# Patient Record
Sex: Female | Born: 1980 | Race: White | Hispanic: No | Marital: Single | Smoking: Current every day smoker
Health system: Southern US, Community
[De-identification: ages and names within clinical notes are randomized; demographics above are authoritative.]

## PROBLEM LIST (undated history)

## (undated) DIAGNOSIS — F32A Depression, unspecified: Secondary | ICD-10-CM

## (undated) DIAGNOSIS — F329 Major depressive disorder, single episode, unspecified: Secondary | ICD-10-CM

## (undated) DIAGNOSIS — IMO0002 Reserved for concepts with insufficient information to code with codable children: Secondary | ICD-10-CM

## (undated) DIAGNOSIS — R87619 Unspecified abnormal cytological findings in specimens from cervix uteri: Secondary | ICD-10-CM

## (undated) DIAGNOSIS — R51 Headache: Secondary | ICD-10-CM

## (undated) DIAGNOSIS — N390 Urinary tract infection, site not specified: Secondary | ICD-10-CM

## (undated) DIAGNOSIS — F419 Anxiety disorder, unspecified: Secondary | ICD-10-CM

## (undated) HISTORY — PX: COLPOSCOPY: SHX161

---

## 2002-05-12 ENCOUNTER — Ambulatory Visit (HOSPITAL_COMMUNITY): Admission: RE | Admit: 2002-05-12 | Discharge: 2002-05-12 | Payer: Self-pay

## 2002-05-20 ENCOUNTER — Inpatient Hospital Stay (HOSPITAL_COMMUNITY): Admission: AD | Admit: 2002-05-20 | Discharge: 2002-05-20 | Payer: Self-pay

## 2002-07-14 ENCOUNTER — Ambulatory Visit (HOSPITAL_COMMUNITY): Admission: RE | Admit: 2002-07-14 | Discharge: 2002-07-14 | Payer: Self-pay

## 2002-08-21 ENCOUNTER — Observation Stay (HOSPITAL_COMMUNITY): Admission: AD | Admit: 2002-08-21 | Discharge: 2002-08-22 | Payer: Self-pay

## 2002-08-29 ENCOUNTER — Inpatient Hospital Stay (HOSPITAL_COMMUNITY): Admission: AD | Admit: 2002-08-29 | Discharge: 2002-08-29 | Payer: Self-pay

## 2002-08-30 ENCOUNTER — Inpatient Hospital Stay (HOSPITAL_COMMUNITY): Admission: AD | Admit: 2002-08-30 | Discharge: 2002-08-30 | Payer: Self-pay

## 2002-09-22 ENCOUNTER — Ambulatory Visit (HOSPITAL_COMMUNITY): Admission: RE | Admit: 2002-09-22 | Discharge: 2002-09-22 | Payer: Self-pay

## 2002-09-26 ENCOUNTER — Encounter (HOSPITAL_COMMUNITY): Admission: RE | Admit: 2002-09-26 | Discharge: 2002-10-26 | Payer: Self-pay

## 2002-10-30 ENCOUNTER — Encounter (HOSPITAL_COMMUNITY): Admission: RE | Admit: 2002-10-30 | Discharge: 2002-11-17 | Payer: Self-pay

## 2002-11-02 ENCOUNTER — Inpatient Hospital Stay (HOSPITAL_COMMUNITY): Admission: AD | Admit: 2002-11-02 | Discharge: 2002-11-02 | Payer: Self-pay

## 2002-11-17 ENCOUNTER — Inpatient Hospital Stay (HOSPITAL_COMMUNITY): Admission: AD | Admit: 2002-11-17 | Discharge: 2002-11-19 | Payer: Self-pay

## 2008-06-22 ENCOUNTER — Other Ambulatory Visit: Admission: RE | Admit: 2008-06-22 | Discharge: 2008-06-22 | Payer: Self-pay | Admitting: Family Medicine

## 2008-07-04 ENCOUNTER — Encounter: Admission: RE | Admit: 2008-07-04 | Discharge: 2008-07-04 | Payer: Self-pay | Admitting: Family Medicine

## 2010-03-25 ENCOUNTER — Emergency Department (HOSPITAL_COMMUNITY): Admission: EM | Admit: 2010-03-25 | Discharge: 2010-03-26 | Payer: Self-pay | Admitting: Emergency Medicine

## 2010-03-26 ENCOUNTER — Inpatient Hospital Stay (HOSPITAL_COMMUNITY): Admission: EM | Admit: 2010-03-26 | Discharge: 2010-03-29 | Payer: Self-pay | Admitting: Psychiatry

## 2010-03-27 ENCOUNTER — Ambulatory Visit: Payer: Self-pay | Admitting: Psychiatry

## 2010-11-16 NOTE — L&D Delivery Note (Signed)
NSD of viable 7 pound female, Apgars 9/9, over intact perineum.  PDI- grossly normal, 2A/1V.  No tears. Breast feed.

## 2011-02-03 LAB — POCT PREGNANCY, URINE: Preg Test, Ur: NEGATIVE

## 2011-02-03 LAB — DIFFERENTIAL
Basophils Absolute: 0 K/uL (ref 0.0–0.1)
Basophils Relative: 0 % (ref 0–1)
Eosinophils Absolute: 0.1 K/uL (ref 0.0–0.7)
Eosinophils Relative: 1 % (ref 0–5)
Lymphocytes Relative: 24 % (ref 12–46)
Lymphs Abs: 2 K/uL (ref 0.7–4.0)
Monocytes Absolute: 0.5 K/uL (ref 0.1–1.0)
Monocytes Relative: 7 % (ref 3–12)
Neutro Abs: 5.5 K/uL (ref 1.7–7.7)
Neutrophils Relative %: 67 % (ref 43–77)

## 2011-02-03 LAB — RAPID URINE DRUG SCREEN, HOSP PERFORMED
Amphetamines: NOT DETECTED
Barbiturates: NOT DETECTED
Benzodiazepines: POSITIVE — AB
Cocaine: POSITIVE — AB
Opiates: NOT DETECTED
Tetrahydrocannabinol: POSITIVE — AB

## 2011-02-03 LAB — CBC
HCT: 40.6 % (ref 36.0–46.0)
Hemoglobin: 13.9 g/dL (ref 12.0–15.0)
Platelets: 175 10*3/uL (ref 150–400)
RDW: 13.2 % (ref 11.5–15.5)
WBC: 8.2 10*3/uL (ref 4.0–10.5)

## 2011-02-03 LAB — POCT I-STAT, CHEM 8
BUN: 9 mg/dL (ref 6–23)
Chloride: 105 mEq/L (ref 96–112)
HCT: 45 % (ref 36.0–46.0)
Potassium: 4.1 mEq/L (ref 3.5–5.1)

## 2011-04-03 NOTE — H&P (Signed)
   NAME:  Katie Marsh, Katie Marsh                             ACCOUNT NO.:  [id number]   MEDICAL RECORD NO.:  [id number]                   PATIENT TYPE:  MAT   LOCATION:  [facility]                                 FACILITY:  [facility]   PHYSICIAN:  Ronda Fairly. Galen Daft, M.D.              DATE OF BIRTH:  1981/09/09   DATE OF ADMISSION:  08/29/2002  DATE OF DISCHARGE:  08/29/2002                                HISTORY & PHYSICAL   CHIEF COMPLAINT:  Pelvic pain.   HISTORY OF PRESENT ILLNESS:  The patient presented to the emergency room  with lower abdominal pain, threatened preterm labor.  No rupture of  membranes, no vaginal bleeding.  She reported a crampy feeling in the lower  pelvis.  She is currently 51 to [redacted] weeks gestation with an estimated date of  confinement of November 16, 2002.   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  She is also a cigarette  smoker.   CURRENT MEDICATIONS:  Prenatal vitamins.   ALLERGIES:  No known drug allergies.   PAST SURGICAL HISTORY:  None.   FAMILY HISTORY:  Grandfather with diabetes.  Father with hypertension.  Breast cancer in a great-grandmother.   SOCIAL HISTORY:  She is a smoker and she works as a Child psychotherapist.   PHYSICAL EXAMINATION:  VITAL SIGNS:  The fetal heart rate is 158.  Her blood  pressure is 110/74.  Height 5 feet 3 inches.  HEENT:  Unremarkable.  ABDOMEN:  Soft, no hepatosplenomegaly, with size equal to dates.  Nontender  examination.  No CVA tenderness.  PELVIC:  Normal external genitalia.  No lesions of the vulva, vagina, or  cervix.  The cervix is 50% effaced with some bulging of the lower uterine  segment from the fetal head, probably +1 station.  The cervix itself is  closed.  FETAL HEART MONITOR:  Reassuring, 158 beats per minute average, and the  variability is appropriate for gestational age, not technically reactive.  There are no uterine contractions noted.  There  is uterine irritability.   ASSESSMENT:  Threatened preterm labor.   PLAN:  Observation and continued fetal monitoring.                                               Ronda Fairly. Galen Daft, M.D.     NJT/MEDQ  D:  09/01/2002  T:  09/01/2002  Job:  [id number]

## 2011-05-14 LAB — HEPATITIS B SURFACE ANTIGEN: Hepatitis B Surface Ag: NEGATIVE

## 2011-05-14 LAB — RPR: RPR: NONREACTIVE

## 2011-05-14 LAB — ANTIBODY SCREEN: Antibody Screen: NEGATIVE

## 2011-05-14 LAB — ABO/RH

## 2011-08-20 ENCOUNTER — Inpatient Hospital Stay (HOSPITAL_COMMUNITY)
Admission: AD | Admit: 2011-08-20 | Discharge: 2011-08-20 | Disposition: A | Discharge status: Home / Self Care | Payer: Self-pay | Source: Ambulatory Visit | Attending: Obstetrics and Gynecology | Admitting: Obstetrics and Gynecology

## 2011-08-20 ENCOUNTER — Encounter (HOSPITAL_COMMUNITY): Payer: Self-pay

## 2011-08-20 DIAGNOSIS — O4700 False labor before 37 completed weeks of gestation, unspecified trimester: Secondary | ICD-10-CM | POA: Insufficient documentation

## 2011-08-20 HISTORY — DX: Anxiety disorder, unspecified: F41.9

## 2011-08-20 HISTORY — DX: Urinary tract infection, site not specified: N39.0

## 2011-08-20 HISTORY — DX: Headache: R51

## 2011-08-20 HISTORY — DX: Depression, unspecified: F32.A

## 2011-08-20 HISTORY — DX: Reserved for concepts with insufficient information to code with codable children: IMO0002

## 2011-08-20 HISTORY — DX: Major depressive disorder, single episode, unspecified: F32.9

## 2011-08-20 HISTORY — DX: Unspecified abnormal cytological findings in specimens from cervix uteri: R87.619

## 2011-08-20 LAB — URINALYSIS, ROUTINE W REFLEX MICROSCOPIC
Glucose, UA: NEGATIVE mg/dL
Nitrite: NEGATIVE
Specific Gravity, Urine: 1.02 (ref 1.005–1.030)
pH: 7 (ref 5.0–8.0)

## 2011-08-20 LAB — URINE MICROSCOPIC-ADD ON

## 2011-08-20 NOTE — ED Provider Notes (Signed)
History     Chief Complaint  Patient presents with  . Contractions   HPIMyra A West30 y.o.patient of Dr. Ewell Poe who is now 26 weeks.  Presents with tightening of her stomach and pressure that began for 5 days. States she pain subsides with rest and restarts with activity.   Called the office yesterday instructed to hydrated.  Hx of preterm labor, tx with meds and rest, went to term.  G2 P1.  Reports spotting a few months back but none since.  States the tightening became more uncomfortable this am.  Reports an uncomplicated pregnancy so far.  Patient appears comfortable.      Past Medical History  Diagnosis Date  . Preterm labor   . Headache   . Asthma     as child  . Urinary tract infection   . Abnormal Pap smear     x1, rpt wnl  . Anxiety   . Depression     hx of med usuage    Past Surgical History  Procedure Date  . No past surgeries     No family history on file.  History  Substance Use Topics  . Smoking status: Current Everyday Smoker -- 0.2 packs/day for 15 years  . Smokeless tobacco: Never Used  . Alcohol Use: No    Allergies: No Known Allergies  Prescriptions prior to admission  Medication Sig Dispense Refill  . prenatal vitamin w/FE, FA (PRENATAL 1 + 1) 27-1 MG TABS Take 1 tablet by mouth daily.          Review of Systems  Gastrointestinal: Abdominal pain: describes as pressure upper mid abdomen.  Genitourinary:       Neg for bleeding.  Musculoskeletal:       Hip pain X 1 month.   Physical Exam   Blood pressure 94/45, pulse 75, temperature 98.8 F (37.1 C), temperature source Oral, resp. rate 18, height 5\' 3"  (1.6 Marsh), weight 133 lb 9.6 oz (60.601 kg), SpO2 100.00%.  Physical Exam    CERVICAL EXAM BY JOLYNN, RN  Ext os fingertip, internal os closed.  30% effaced.    MAU Course  Procedures Fetal Monitor shows FHR 145-150.  Reassuring.  Occasional uterine irritability lasting 30 secs MDM Reported MSE to Dr. Claiborne Billings @3 :08pm.  Check cervix, if  closed may discharge to home with instructions to call if she has >6 contractions in 1 hour.  Keep scheduled appointment Reported by Early Osmond, RN that Dr. Claiborne Billings had called after reviewing her FMS wants to continue to monitor.  Now having contractions as opposed to irritability.  Early Osmond, RN reported repeat cervical exam.  Per her report, Dr. Claiborne Billings reviewed strip and gave discharge orders. Assessment and Plan  A:  Braxton-Hicks contractions P   Per Dr. Claiborne Billings.  Katie Marsh,Katie Marsh 08/20/2011, 2:55 PM   Katie Holmes, NP 08/20/11 1859

## 2011-08-20 NOTE — Progress Notes (Signed)
Pt states she has been having contractions since Sunday but have become more painful. Has not timed them. Reports good fetal movement and no bleeding or leaking fluid.

## 2011-11-13 ENCOUNTER — Inpatient Hospital Stay (HOSPITAL_COMMUNITY)
Admission: AD | Admit: 2011-11-13 | Discharge: 2011-11-17 | DRG: 767 | Disposition: A | Discharge status: Home / Self Care | Payer: Medicaid Other | Source: Ambulatory Visit | Attending: Obstetrics & Gynecology | Admitting: Obstetrics & Gynecology

## 2011-11-13 DIAGNOSIS — Z302 Encounter for sterilization: Secondary | ICD-10-CM

## 2011-11-13 DIAGNOSIS — Z3480 Encounter for supervision of other normal pregnancy, unspecified trimester: Secondary | ICD-10-CM

## 2011-11-13 NOTE — Progress Notes (Signed)
Pt states, " I've had contractions since 7 pm and they have gotten closer in the last hour. I think I lost my mucus plug tonight too."

## 2011-11-14 ENCOUNTER — Encounter (HOSPITAL_COMMUNITY): Payer: Self-pay | Admitting: Anesthesiology

## 2011-11-14 ENCOUNTER — Inpatient Hospital Stay (HOSPITAL_COMMUNITY): Payer: Medicaid Other | Admitting: Anesthesiology

## 2011-11-14 ENCOUNTER — Encounter (HOSPITAL_COMMUNITY): Payer: Self-pay | Admitting: Family Medicine

## 2011-11-14 ENCOUNTER — Encounter (HOSPITAL_COMMUNITY): Payer: Self-pay | Admitting: *Deleted

## 2011-11-14 LAB — CBC
HCT: 39.3 % (ref 36.0–46.0)
HCT: 35 % — ABNORMAL LOW (ref 36.0–46.0)
Hemoglobin: 11.7 g/dL — ABNORMAL LOW (ref 12.0–15.0)
MCHC: 33.4 g/dL (ref 30.0–36.0)
MCV: 89.1 fL (ref 78.0–100.0)
Platelets: 132 10*3/uL — ABNORMAL LOW (ref 150–400)
RBC: 4.41 MIL/uL (ref 3.87–5.11)
WBC: 18.3 10*3/uL — ABNORMAL HIGH (ref 4.0–10.5)

## 2011-11-14 LAB — ABO/RH: ABO/RH(D): O POS

## 2011-11-14 MED ORDER — ACETAMINOPHEN 325 MG PO TABS: 650.0000 mg | ORAL_TABLET | ORAL | Status: DC | PRN

## 2011-11-14 MED ORDER — SIMETHICONE 80 MG PO CHEW: 80.0000 mg | CHEWABLE_TABLET | ORAL | Status: DC | PRN

## 2011-11-14 MED ORDER — LIDOCAINE HCL 1.5 % IJ SOLN
INTRAMUSCULAR | Status: DC | PRN
  Administered 2011-11-14: 4 mL via EPIDURAL
  Administered 2011-11-14: 4 mL via INTRADERMAL

## 2011-11-14 MED ORDER — OXYTOCIN BOLUS FROM INFUSION
500.0000 mL | Freq: Once | INTRAVENOUS | Status: DC
  Filled 2011-11-14: qty 500
  Filled 2011-11-14: qty 1000

## 2011-11-14 MED ORDER — CITRIC ACID-SODIUM CITRATE 334-500 MG/5ML PO SOLN: 30.0000 mL | ORAL | Status: DC | PRN

## 2011-11-14 MED ORDER — FENTANYL 2.5 MCG/ML BUPIVACAINE 1/10 % EPIDURAL INFUSION (WH - ANES)
INTRAMUSCULAR | Status: DC | PRN
  Administered 2011-11-14: 14 mL/h via EPIDURAL

## 2011-11-14 MED ORDER — TETANUS-DIPHTH-ACELL PERTUSSIS 5-2.5-18.5 LF-MCG/0.5 IM SUSP
0.5000 mL | Freq: Once | INTRAMUSCULAR | Status: AC
  Administered 2011-11-15: 0.5 mL via INTRAMUSCULAR

## 2011-11-14 MED ORDER — LIDOCAINE HCL (PF) 1 % IJ SOLN
30.0000 mL | INTRAMUSCULAR | Status: DC | PRN
  Filled 2011-11-14: qty 30

## 2011-11-14 MED ORDER — ONDANSETRON HCL 4 MG/2ML IJ SOLN: 4.0000 mg | INTRAMUSCULAR | Status: DC | PRN

## 2011-11-14 MED ORDER — ONDANSETRON HCL 4 MG PO TABS: 4.0000 mg | ORAL_TABLET | ORAL | Status: DC | PRN

## 2011-11-14 MED ORDER — SODIUM CHLORIDE 0.9 % IV SOLN: 250.0000 mL | INTRAVENOUS | Status: DC | PRN

## 2011-11-14 MED ORDER — OXYTOCIN 20 UNITS IN LACTATED RINGERS INFUSION - SIMPLE
125.0000 mL/h | Freq: Once | INTRAVENOUS | Status: AC
  Administered 2011-11-14: 999 mL/h via INTRAVENOUS

## 2011-11-14 MED ORDER — FLEET ENEMA 7-19 GM/118ML RE ENEM: 1.0000 | ENEMA | RECTAL | Status: DC | PRN

## 2011-11-14 MED ORDER — PHENYLEPHRINE 40 MCG/ML (10ML) SYRINGE FOR IV PUSH (FOR BLOOD PRESSURE SUPPORT)
80.0000 ug | PREFILLED_SYRINGE | INTRAVENOUS | Status: DC | PRN
  Filled 2011-11-14: qty 5

## 2011-11-14 MED ORDER — SODIUM CHLORIDE 0.9 % IJ SOLN: 3.0000 mL | Freq: Two times a day (BID) | INTRAMUSCULAR | Status: DC

## 2011-11-14 MED ORDER — FENTANYL 2.5 MCG/ML BUPIVACAINE 1/10 % EPIDURAL INFUSION (WH - ANES)
14.0000 mL/h | INTRAMUSCULAR | Status: DC
  Filled 2011-11-14: qty 60

## 2011-11-14 MED ORDER — OXYCODONE-ACETAMINOPHEN 5-325 MG PO TABS
1.0000 | ORAL_TABLET | ORAL | Status: DC | PRN
  Administered 2011-11-14 – 2011-11-17 (×6): 1 via ORAL
  Filled 2011-11-14 (×6): qty 1

## 2011-11-14 MED ORDER — WITCH HAZEL-GLYCERIN EX PADS: 1.0000 "application " | MEDICATED_PAD | CUTANEOUS | Status: DC | PRN

## 2011-11-14 MED ORDER — SODIUM CHLORIDE 0.9 % IJ SOLN: 3.0000 mL | INTRAMUSCULAR | Status: DC | PRN

## 2011-11-14 MED ORDER — EPHEDRINE 5 MG/ML INJ: 10.0000 mg | INTRAVENOUS | Status: DC | PRN

## 2011-11-14 MED ORDER — LACTATED RINGERS IV SOLN: 500.0000 mL | INTRAVENOUS | Status: DC | PRN

## 2011-11-14 MED ORDER — LANOLIN HYDROUS EX OINT: TOPICAL_OINTMENT | CUTANEOUS | Status: DC | PRN

## 2011-11-14 MED ORDER — ZOLPIDEM TARTRATE 5 MG PO TABS: 5.0000 mg | ORAL_TABLET | Freq: Every evening | ORAL | Status: DC | PRN

## 2011-11-14 MED ORDER — IBUPROFEN 600 MG PO TABS: 600.0000 mg | ORAL_TABLET | Freq: Four times a day (QID) | ORAL | Status: DC | PRN

## 2011-11-14 MED ORDER — OXYCODONE-ACETAMINOPHEN 5-325 MG PO TABS: 2.0000 | ORAL_TABLET | ORAL | Status: DC | PRN

## 2011-11-14 MED ORDER — PRENATAL MULTIVITAMIN CH
1.0000 | ORAL_TABLET | Freq: Every day | ORAL | Status: DC
  Administered 2011-11-14 – 2011-11-15 (×2): 1 via ORAL
  Filled 2011-11-14 (×2): qty 1

## 2011-11-14 MED ORDER — EPHEDRINE 5 MG/ML INJ
10.0000 mg | INTRAVENOUS | Status: DC | PRN
  Filled 2011-11-14: qty 4

## 2011-11-14 MED ORDER — DIBUCAINE 1 % RE OINT: 1.0000 "application " | TOPICAL_OINTMENT | RECTAL | Status: DC | PRN

## 2011-11-14 MED ORDER — SENNOSIDES-DOCUSATE SODIUM 8.6-50 MG PO TABS
2.0000 | ORAL_TABLET | Freq: Every day | ORAL | Status: DC
  Administered 2011-11-14 – 2011-11-15 (×2): 2 via ORAL

## 2011-11-14 MED ORDER — LACTATED RINGERS IV SOLN
INTRAVENOUS | Status: DC
  Administered 2011-11-14 (×2): via INTRAVENOUS

## 2011-11-14 MED ORDER — BENZOCAINE-MENTHOL 20-0.5 % EX AERO: 1.0000 "application " | INHALATION_SPRAY | CUTANEOUS | Status: DC | PRN

## 2011-11-14 MED ORDER — DIPHENHYDRAMINE HCL 50 MG/ML IJ SOLN: 12.5000 mg | INTRAMUSCULAR | Status: DC | PRN

## 2011-11-14 MED ORDER — IBUPROFEN 600 MG PO TABS
600.0000 mg | ORAL_TABLET | Freq: Four times a day (QID) | ORAL | Status: DC
  Administered 2011-11-14 – 2011-11-17 (×12): 600 mg via ORAL
  Filled 2011-11-14 (×12): qty 1

## 2011-11-14 MED ORDER — ONDANSETRON HCL 4 MG/2ML IJ SOLN
4.0000 mg | Freq: Four times a day (QID) | INTRAMUSCULAR | Status: DC | PRN
  Administered 2011-11-14: 4 mg via INTRAVENOUS
  Filled 2011-11-14: qty 2

## 2011-11-14 MED ORDER — BENZOCAINE-MENTHOL 20-0.5 % EX AERO
INHALATION_SPRAY | CUTANEOUS | Status: AC
  Filled 2011-11-14: qty 56

## 2011-11-14 MED ORDER — PHENYLEPHRINE 40 MCG/ML (10ML) SYRINGE FOR IV PUSH (FOR BLOOD PRESSURE SUPPORT): 80.0000 ug | PREFILLED_SYRINGE | INTRAVENOUS | Status: DC | PRN

## 2011-11-14 MED ORDER — LACTATED RINGERS IV SOLN
500.0000 mL | Freq: Once | INTRAVENOUS | Status: AC
  Administered 2011-11-14: 1000 mL via INTRAVENOUS

## 2011-11-14 MED ORDER — DIPHENHYDRAMINE HCL 25 MG PO CAPS: 25.0000 mg | ORAL_CAPSULE | Freq: Four times a day (QID) | ORAL | Status: DC | PRN

## 2011-11-14 NOTE — Anesthesia Preprocedure Evaluation (Signed)
Anesthesia Evaluation  Patient identified by MRN, date of birth, ID band Patient awake    Reviewed: Allergy & Precautions, H&P , Patient's Chart, lab work & pertinent test results  Airway Mallampati: II TM Distance: >3 FB Neck ROM: full    Dental No notable dental hx. (+) Teeth Intact   Pulmonary neg pulmonary ROS, asthma ,  clear to auscultation  Pulmonary exam normal       Cardiovascular neg cardio ROS regular Normal    Neuro/Psych  Headaches, Negative Neurological ROS  Negative Psych ROS   GI/Hepatic negative GI ROS, Neg liver ROS,   Endo/Other  Negative Endocrine ROS  Renal/GU negative Renal ROS  Genitourinary negative   Musculoskeletal   Abdominal Normal abdominal exam  (+)   Peds  Hematology negative hematology ROS (+)   Anesthesia Other Findings   Reproductive/Obstetrics (+) Pregnancy                           Anesthesia Physical Anesthesia Plan  ASA: II  Anesthesia Plan: Epidural   Post-op Pain Management:    Induction:   Airway Management Planned:   Additional Equipment:   Intra-op Plan:   Post-operative Plan:   Informed Consent: I have reviewed the patients History and Physical, chart, labs and discussed the procedure including the risks, benefits and alternatives for the proposed anesthesia with the patient or authorized representative who has indicated his/her understanding and acceptance.     Plan Discussed with: Anesthesiologist and Surgeon  Anesthesia Plan Comments:         Anesthesia Quick Evaluation

## 2011-11-14 NOTE — Progress Notes (Signed)
Dr Aldona Bar notified of pt's admission and status. Will admit to BS.

## 2011-11-14 NOTE — Progress Notes (Signed)
To BS via w/c °

## 2011-11-14 NOTE — Progress Notes (Signed)
Comfortable with epidural.  FH reassuring.  CX 6/90/vtx-1 -- AROM.

## 2011-11-14 NOTE — Anesthesia Procedure Notes (Signed)
Epidural Patient location during procedure: OB Start time: 11/14/2011 2:14 AM  Staffing Anesthesiologist: Shanen Norris A. Performed by: anesthesiologist   Preanesthetic Checklist Completed: patient identified, site marked, surgical consent, pre-op evaluation, timeout performed, IV checked, risks and benefits discussed and monitors and equipment checked  Epidural Patient position: sitting Prep: site prepped and draped and DuraPrep Patient monitoring: continuous pulse ox and blood pressure Approach: midline Injection technique: LOR air  Needle:  Needle type: Tuohy  Needle gauge: 17 G Needle length: 9 cm Needle insertion depth: 4 cm Catheter type: closed end flexible Catheter size: 19 Gauge Catheter at skin depth: 9 cm Test dose: negative and 1.5% lidocaine  Assessment Events: blood not aspirated, injection not painful, no injection resistance, negative IV test and no paresthesia  Additional Notes Patient is more comfortable after epidural dosed. Please see RN's note for documentation of vital signs and FHR which are stable.

## 2011-11-14 NOTE — Progress Notes (Signed)
Doing well.  Breast feeding.  Possible discharge tomorrow.  Did mention that she signed a PP Tubal Sterilization form on the office, but no forms available on media chart.  Will have her see her Primary Doc (Dr. Dareen Piano) for [facility] visit and have it done shortly thereafter.

## 2011-11-14 NOTE — Anesthesia Postprocedure Evaluation (Signed)
  Anesthesia Post-op Note  Patient: Katie Marsh  Procedure(s) Performed: * No procedures listed *  Patient Location: Mother/Baby  Anesthesia Type: Epidural  Level of Consciousness: awake, alert  and oriented  Airway and Oxygen Therapy: Patient Spontanous Breathing  Post-op Pain: mild  Post-op Assessment: Patient's Cardiovascular Status Stable, Respiratory Function Stable, Patent Airway, No signs of Nausea or vomiting and Pain level controlled  Post-op Vital Signs: stable  Complications: No apparent anesthesia complications

## 2011-11-14 NOTE — Progress Notes (Signed)
Dr. Aldona Bar notified of pt status, FHR, decelerations, and SVE. Orders received to begin pushing. Will Continue to monitor.

## 2011-11-14 NOTE — H&P (Signed)
30 year old G2P1 EDC 11-20-11 now 39 weeks and 1 day admitted earlier in labor.  Pregnancy relatively benign.  -GBS.    PE:  VS stable Abd  S=D and FH reactive CX: (on admit by MAU) 4+/80/-1  IMP:  Term, Labor Plan:  Expectant management

## 2011-11-14 NOTE — Progress Notes (Signed)
Pt sitting straight up in bed making it difficult to monitor [facility] at times.

## 2011-11-14 NOTE — ED Notes (Signed)
Report called to [employer] in Bs.

## 2011-11-14 NOTE — Progress Notes (Signed)
NSVD of a viable female.  

## 2011-11-15 NOTE — Progress Notes (Signed)
Peds prefers not to discharge baby today; Mom does not wish to be discharged - offered having baby take her room and Mom stay with her -- but Mom wishes discharge cancelled for today.

## 2011-11-15 NOTE — Progress Notes (Signed)
PSYCHOSOCIAL ASSESSMENT ~ MATERNAL/CHILD Name: Katie Marsh Age: 1day Referral Date: 11/15/2011 Reason/Source: hx or drug use, hx anx/dep  I. FAMILY/HOME ENVIRONMENT A. Child's Legal Guardian Name: Katie Marsh  DOB:  05/25/2010                                                Age: 30 Address: [address]                  [place], [place] [address]  Name: Katie Marsh DOB:                                                  Age: Address: [address]                 [place], [place] [address]  B. Other Household Members/Support Persons Name: Relationship:                    DOB:        Name:                    Relationship:               DOB:        Name:                         Relationship:               DOB:                   Name:                   Relationship:               DOB: C.   Other Support:   II. PSYCHOSOCIAL DATA A. Information Source:                    B. Event organiser         Employment:    Medicaid:  YES   County: [employer]  Private Insurance: [employer]                            Self Pay:   Food Stamps:        WIC:  YES     Work First:       Transport planner:       Section 8:    Maternity Care Coordination/Child Service Coordination/Early Intervention   School:                                                                       Grade:  Other:  C. Cultural and Environment Information Cultural Issues Impacting Care III. STRENGTHS             Supportive family/friends: YES  Adequate Resources: YES             Compliance with medical plan: YES             Home prepared for Child (including basic supplies): YES             Understanding of Illness: N/A             Other:   IV. RISK FACTORS AND CURRENT PROBLEMS       No Problems Noted               Substance abuse:                                    Pt:    Hx or drug use        Family:             Family/Relationship Issues:                     Pt:             Family:             Financial Resources:                               Pt:            Family:             DSS Involvement:                                    Pt:             Family:             Knowledge/Cognitive Deficit:                   Pt:             Family:                Basic Needs(food, housing, etc.)             Pt:             Family:             Mental Illness:                                           Pt:             Family:             Abuse/Neglect/Domestic Violence           Pt:             Family:             Transportation:                                         Pt:              Family:  Adjustment to Illness:                               Pt:              Family:             Compliance with Treatment:                    Pt:              Family:             Housing Concerns                                   Pt:              Family:             Other:               V. SOCIAL WORK ASSESSMENT CSW spoke to MOB and FOB.  Both were understanding of need for UDS and MEC due to MOB drug use hx.  MOB does not report any current concerns with anxiety or depression and knows to make RN or CSW aware if any concerns arise.  MOB and FOB report having UMR and medicaid and do not have any financial concerns at this time.  Neither express any concern with supplies and report having good family support.  Please reconsult CSW if any further needs arise.   VI. SOCIAL WORK PLAN (in bold)             No Further Intervention Required/ No Barriers to Discharge             Psychosocial Support and Ongoing Assessment if Needs             Patient/Family Education             Child Protective Services Report                      [place]:                       Date:             Information/Referral to [facility]             Other

## 2011-11-15 NOTE — Progress Notes (Signed)
Rn notify Dr. Aldona Bar that patient would like to stay and cancel discharge due to Pediatrician not discharging baby. Rn talk with Mom prior to calling Dr. Aldona Bar about Mom being discharge and baby becoming baby patient. At this time Mom requested to stay as a patient. Dr. Aldona Bar made aware of the request. Rn will monitor for now.

## 2011-11-15 NOTE — Discharge Summary (Signed)
Discharge diagnoses-  #1-39 week intrauterine pregnancy delivered 7 pound female infant Apgars 9 and 9  #2-blood type O-positive  Procedures-  #1-normal spontaneous delivery  Summary-this gravida 2 now para 2 presented in labor on the morning of 12/29 and delivered a 7 pound female infant with Apgars of 9 and 9 over an intact perineum.  Her postpartum course was uncomplicated. She was breast-feeding. Her CBC on the morning of 12/30 was 11.7-23.6 with a platelet count of 112,000.  She was tolerating a regular diet well, was afebrile, vital signs were stable and her breast-feeding was going well.   She was given all of her postpartum instructions per discharge brochure and understood all instructions well. Discharge medications include vitamins one a day as long as breast-feeding and she was given prescriptions for Percocet 5/325 to use 1-2 every 4-6 hours as needed for severe pain and Motrin 600 mg to use one every 6 hours as needed for cramping or mild pain.  She will return to see Dr. Dareen Piano, her primary physician, in approximately 3 weeks so as a sterilization procedure can be planned and carried out. She will refrain from intercourse until after the sterilization procedure has been carried out.  Condition on discharge improved.

## 2011-11-16 ENCOUNTER — Inpatient Hospital Stay (HOSPITAL_COMMUNITY): Payer: Medicaid Other | Admitting: Anesthesiology

## 2011-11-16 ENCOUNTER — Encounter (HOSPITAL_COMMUNITY)
Admission: AD | Disposition: A | Discharge status: Home / Self Care | Payer: Self-pay | Source: Ambulatory Visit | Attending: Obstetrics & Gynecology

## 2011-11-16 ENCOUNTER — Encounter (HOSPITAL_COMMUNITY): Payer: Self-pay | Admitting: Anesthesiology

## 2011-11-16 ENCOUNTER — Other Ambulatory Visit: Payer: Self-pay | Admitting: Obstetrics & Gynecology

## 2011-11-16 HISTORY — PX: TUBAL LIGATION: SHX77

## 2011-11-16 SURGERY — LIGATION, FALLOPIAN TUBE, POSTPARTUM
Anesthesia: Spinal | Site: Abdomen | Laterality: Bilateral | Wound class: Clean Contaminated

## 2011-11-16 MED ORDER — ONDANSETRON HCL 4 MG/2ML IJ SOLN
INTRAMUSCULAR | Status: DC | PRN
  Administered 2011-11-16: 4 mg via INTRAVENOUS

## 2011-11-16 MED ORDER — FAMOTIDINE 20 MG PO TABS
40.0000 mg | ORAL_TABLET | Freq: Once | ORAL | Status: AC
  Administered 2011-11-16: 40 mg via ORAL
  Filled 2011-11-16: qty 2

## 2011-11-16 MED ORDER — KETOROLAC TROMETHAMINE 30 MG/ML IJ SOLN
INTRAMUSCULAR | Status: AC
  Filled 2011-11-16: qty 1

## 2011-11-16 MED ORDER — LACTATED RINGERS IV SOLN
INTRAVENOUS | Status: DC
  Administered 2011-11-16 (×2): via INTRAVENOUS

## 2011-11-16 MED ORDER — PROMETHAZINE HCL 25 MG/ML IJ SOLN: 6.2500 mg | INTRAMUSCULAR | Status: DC | PRN

## 2011-11-16 MED ORDER — ACETAMINOPHEN 325 MG PO TABS: 325.0000 mg | ORAL_TABLET | ORAL | Status: DC | PRN

## 2011-11-16 MED ORDER — FENTANYL CITRATE 0.05 MG/ML IJ SOLN
INTRAMUSCULAR | Status: DC | PRN
  Administered 2011-11-16 (×2): 50 ug via INTRAVENOUS

## 2011-11-16 MED ORDER — KETOROLAC TROMETHAMINE 30 MG/ML IJ SOLN
15.0000 mg | Freq: Once | INTRAMUSCULAR | Status: AC | PRN
  Administered 2011-11-16: 30 mg via INTRAVENOUS

## 2011-11-16 MED ORDER — MIDAZOLAM HCL 5 MG/5ML IJ SOLN
INTRAMUSCULAR | Status: DC | PRN
  Administered 2011-11-16 (×2): 1 mg via INTRAVENOUS

## 2011-11-16 MED ORDER — FENTANYL CITRATE 0.05 MG/ML IJ SOLN
INTRAMUSCULAR | Status: AC
  Filled 2011-11-16: qty 4

## 2011-11-16 MED ORDER — FENTANYL CITRATE 0.05 MG/ML IJ SOLN: 25.0000 ug | INTRAMUSCULAR | Status: DC | PRN

## 2011-11-16 MED ORDER — METOCLOPRAMIDE HCL 10 MG PO TABS
10.0000 mg | ORAL_TABLET | Freq: Once | ORAL | Status: AC
  Administered 2011-11-16: 10 mg via ORAL
  Filled 2011-11-16: qty 1

## 2011-11-16 MED ORDER — BUPIVACAINE IN DEXTROSE 0.75-8.25 % IT SOLN
INTRATHECAL | Status: DC | PRN
  Administered 2011-11-16: 1.4 mL via INTRATHECAL

## 2011-11-16 MED ORDER — LIDOCAINE HCL (PF) 1 % IJ SOLN
INTRAMUSCULAR | Status: DC | PRN
  Administered 2011-11-16: 1 mL

## 2011-11-16 MED ORDER — MIDAZOLAM HCL 2 MG/2ML IJ SOLN
INTRAMUSCULAR | Status: AC
  Filled 2011-11-16: qty 2

## 2011-11-16 SURGICAL SUPPLY — 21 items
CLOTH BEACON ORANGE TIMEOUT ST (SAFETY) ×2 IMPLANT
CONTAINER PREFILL 10% NBF 15ML (MISCELLANEOUS) ×4 IMPLANT
DRSG TELFA 4X14 ISLAND ADH (GAUZE/BANDAGES/DRESSINGS) ×1 IMPLANT
ELECT REM PT RETURN 9FT ADLT (ELECTROSURGICAL) ×2
ELECTRODE REM PT RTRN 9FT ADLT (ELECTROSURGICAL) ×1 IMPLANT
GLOVE ECLIPSE 7.0 STRL STRAW (GLOVE) ×4 IMPLANT
GOWN PREVENTION PLUS LG XLONG (DISPOSABLE) ×2 IMPLANT
GOWN PREVENTION PLUS XLARGE (GOWN DISPOSABLE) ×2 IMPLANT
NDL HYPO 25X1 1.5 SAFETY (NEEDLE) IMPLANT
NEEDLE HYPO 25X1 1.5 SAFETY (NEEDLE) ×2 IMPLANT
NS IRRIG 1000ML POUR BTL (IV SOLUTION) ×2 IMPLANT
PACK ABDOMINAL MINOR (CUSTOM PROCEDURE TRAY) ×2 IMPLANT
PENCIL BUTTON HOLSTER BLD 10FT (ELECTRODE) ×2 IMPLANT
SPONGE LAP 4X18 X RAY DECT (DISPOSABLE) IMPLANT
SUT MNCRL AB 0 CT1 27 (SUTURE) ×1 IMPLANT
SUT PLAIN 0 NONE (SUTURE) ×1 IMPLANT
SUT VICRYL RAPIDE 4/0 PS 2 (SUTURE) ×1 IMPLANT
SYR CONTROL 10ML LL (SYRINGE) ×1 IMPLANT
TOWEL OR 17X24 6PK STRL BLUE (TOWEL DISPOSABLE) ×4 IMPLANT
TRAY FOLEY CATH 14FR (SET/KITS/TRAYS/PACK) ×2 IMPLANT
WATER STERILE IRR 1000ML POUR (IV SOLUTION) ×1 IMPLANT

## 2011-11-16 NOTE — Progress Notes (Signed)
UR chart review completed.  

## 2011-11-16 NOTE — Progress Notes (Signed)
Patient is drank small amount of coffee this am, but is desiring pptl. Pt is ambulating, voiding.  Pain control is good.  Filed Vitals:   11/14/11 1930 11/15/11 0545 11/15/11 2147 11/16/11 0618  BP: 121/73 102/64 122/77 105/66  Pulse: 71 82 82 83  Temp: 98.4 F (36.9 C) 98 F (36.7 C) 98.7 F (37.1 C) 98.6 F (37 C)  TempSrc:   Oral Oral  Resp: 18 18 18 18   Height:      Weight:      SpO2:        Fundus firm @ umbilicus   Lab Results  Component Value Date   WBC 23.6* 11/14/2011   HGB 11.7* 11/14/2011   HCT 35.0* 11/14/2011   MCV 88.6 11/14/2011   PLT 112* 11/14/2011    --/--/O POS (12/29 0200)/RI  A/P Post partum day 2 NPO Likely PPTL later today  Routine care.  Expect d/c after tubal or in am.    Katie Marsh

## 2011-11-16 NOTE — Anesthesia Postprocedure Evaluation (Signed)
  Anesthesia Post-op Note  Patient: Katie Marsh  Procedure(s) Performed:  POST PARTUM TUBAL LIGATION  Patient Location: PACU  Anesthesia Type: Regional  Level of Consciousness: awake  Airway and Oxygen Therapy: Patient Spontanous Breathing  Post-op Pain: none  Post-op Assessment: Post-op Vital signs reviewed  Post-op Vital Signs: Reviewed and stable  Complications: No apparent anesthesia complications

## 2011-11-16 NOTE — Op Note (Signed)
Pre-op: desired PPTL Post-op: same Procedure: PPTL Surgeon: Delray Alt Anesthesia: spinal Fluids: see anesth report EBL: minimal UOP: 100cc clear Complications: none Condition: stable to PACU

## 2011-11-16 NOTE — Anesthesia Procedure Notes (Signed)
Spinal  Patient location during procedure: OR Start time: 11/16/2011 5:33 PM Staffing Anesthesiologist: Brayton Caves R Performed by: anesthesiologist  Preanesthetic Checklist Completed: patient identified, site marked, surgical consent, pre-op evaluation, timeout performed, IV checked, risks and benefits discussed and monitors and equipment checked Spinal Block Patient position: sitting Prep: DuraPrep Patient monitoring: heart rate, cardiac monitor, continuous pulse ox and blood pressure Approach: midline Location: L3-4 Injection technique: single-shot Needle Needle type: Sprotte  Needle gauge: 24 G Needle length: 9 cm Assessment Sensory level: T4 Additional Notes Patient identified.  Risk benefits discussed including failed block, incomplete pain control, headache, nerve damage, paralysis, blood pressure changes, nausea, vomiting, reactions to medication both toxic or allergic, and postpartum back pain.  Patient expressed understanding and wished to proceed.  All questions were answered.  Sterile technique used throughout procedure.  CSF was clear.  No parasthesia or other complications.  Please see nursing notes for vital signs.

## 2011-11-16 NOTE — Anesthesia Preprocedure Evaluation (Signed)
Anesthesia Evaluation  Patient identified by MRN, date of birth, ID band Patient awake    Reviewed: Allergy & Precautions, H&P , Patient's Chart, lab work & pertinent test results  Airway Mallampati: II      Dental No notable dental hx.    Pulmonary neg pulmonary ROS, asthma ,  clear to auscultation  Pulmonary exam normal       Cardiovascular Exercise Tolerance: Good neg cardio ROS regular Normal    Neuro/Psych  Headaches, PSYCHIATRIC DISORDERS Negative Neurological ROS  Negative Psych ROS   GI/Hepatic negative GI ROS, Neg liver ROS,   Endo/Other  Negative Endocrine ROS  Renal/GU negative Renal ROS  Genitourinary negative   Musculoskeletal   Abdominal Normal abdominal exam  (+)   Peds  Hematology negative hematology ROS (+)   Anesthesia Other Findings   Reproductive/Obstetrics negative OB ROS                           Anesthesia Physical Anesthesia Plan  ASA: II  Anesthesia Plan: Spinal   Post-op Pain Management:    Induction:   Airway Management Planned:   Additional Equipment:   Intra-op Plan:   Post-operative Plan:   Informed Consent: I have reviewed the patients History and Physical, chart, labs and discussed the procedure including the risks, benefits and alternatives for the proposed anesthesia with the patient or authorized representative who has indicated his/her understanding and acceptance.     Plan Discussed with: Anesthesiologist, CRNA and Surgeon  Anesthesia Plan Comments:         Anesthesia Quick Evaluation

## 2011-11-16 NOTE — Transfer of Care (Signed)
Immediate Anesthesia Transfer of Care Note  Patient: Katie Marsh  Procedure(s) Performed:  POST PARTUM TUBAL LIGATION  Patient Location: PACU  Anesthesia Type: Spinal  Level of Consciousness: awake, alert , oriented and patient cooperative  Airway & Oxygen Therapy: Patient Spontanous Breathing  Post-op Assessment: Report given to PACU RN and Post -op Vital signs reviewed and stable  Post vital signs: Reviewed and stable  Complications: No apparent anesthesia complications

## 2011-11-17 NOTE — Anesthesia Postprocedure Evaluation (Signed)
  Anesthesia Post Note  Patient: Katie Marsh  Procedure(s) Performed:  POST PARTUM TUBAL LIGATION  Anesthesia type: Spinal  Patient location: Mother/Baby  Post pain: Pain level controlled  Post assessment: Post-op Vital signs reviewed  Last Vitals:  Filed Vitals:   11/17/11 0600  BP: 100/67  Pulse: 66  Temp: 36.7 C  Resp: 20    Post vital signs: Reviewed  Level of consciousness: awake  Complications: No apparent anesthesia complications

## 2011-11-17 NOTE — Discharge Summary (Signed)
Discharge diagnoses  #1-39 week intrauterine pregnancy delivered 7 pound female infant Apgars 9 and 9  #2-blood type O+  #3-desire for permanent elective sterilization postpartum  Procedures  #1-normal spontaneous delivery  #2-postpartum tubal sterilization procedure  Summary-this gravida 2 now para 2 presented in labor on the morning of 12/29 and delivered a 7 pound female infant with Apgars of 9 and 9 over an intact perineum. Postpartum course was uncomplicated. She was breast-feeding. Her CBC on the morning of 12/30 was 11.7-23.6 with a platelet count 112,000.  On 12/31 she was taken to the operating room for a postpartum tubal sterilization procedure by Dr. Claiborne Billings which went well.  On 1/1 the patient was tolerating a regular diet well was afebrile vital signs were stable her breast-feeding was going well and she was ready for discharge.  She was given all of her was for instructions for discharge were sure and under sterile instructions well. Discharge medications include vitamins-1 a day as long as the patient is breast-feeding and she was given prescriptions for Percocet 5/325 to use 1-2 every 4-6 hours as needed for severe pain and Motrin 600 mg to use one every 6 hours as needed for cramping or mild pain.  We'll return to see Dr. Orson Aloe, her primary physician in approximately 4 weeks' time.  Condition on discharge improved  This is a second discharge summary as the patient was not discharged on 12/30 as previously planned.

## 2011-11-17 NOTE — Anesthesia Postprocedure Evaluation (Signed)
Anesthesia Post Note  Patient: Katie Marsh  Procedure(s) Performed:  POST PARTUM TUBAL LIGATION  Anesthesia type: Spinal  Patient location: PACU  Post pain: Pain level controlled  Post assessment: Post-op Vital signs reviewed  Post vital signs: Reviewed  Level of consciousness: awake  Complications: No apparent anesthesia complications

## 2011-11-18 ENCOUNTER — Encounter (HOSPITAL_COMMUNITY): Payer: Self-pay | Admitting: Obstetrics and Gynecology

## 2011-11-19 ENCOUNTER — Encounter (HOSPITAL_COMMUNITY): Payer: Self-pay

## 2011-11-21 NOTE — Op Note (Signed)
[name], [name]                   ACCOUNT NO.:  [id number]  MEDICAL RECORD NO.:  [id number]  LOCATION:  9119                          FACILITY:  [facility]  PHYSICIAN:  Philip Aspen, DO    DATE OF BIRTH:  1981/10/31  DATE OF PROCEDURE: DATE OF DISCHARGE:  11/17/2011                              OPERATIVE REPORT   PREOPERATIVE DIAGNOSIS:  Desire permanent sterilization.  POSTOPERATIVE DIAGNOSIS:  Desire permanent sterilization.  PROCEDURE:  Postpartum tubal ligation.  SURGEON:  Philip Aspen, DO  ANESTHESIA:  Spinal with IV sedation and 1 mL of 1% lidocaine at incision.  FLUIDS:  Please see anesthesia report.  ESTIMATED BLOOD LOSS:  Minimal.  URINE OUTPUT:  100 mL.  SPECIMENS:  Portions of bilateral tubes.  COMPLICATIONS:  None.  CONDITION:  Stable to PACU.  DESCRIPTION OF PROCEDURE:  The patient was brought to the operating room after proper identification and consent performed.  Spinal anesthesia was administered and found to be adequate.  She was prepped and draped in a normal sterile fashion in dorsal supine position.  After Foley catheter was placed, approximately 3-4 cm infraumbilical transverse skin incision made in Hasson fashion and brought down to underlying layer of fascia with use of hemostat for tenting and Metzenbaum scissors.  The peritoneum was entered sharply by the same method.  The patient was tilted to left.  The right fallopian tube was located and followed to its fimbriated end.  A knuckle of that tube was externalized.  A scissor was placed through the mesosalpinx and portion of that tube was tied up creating a knuckle.  A second free tie was placed below the first. Portion of tube was excised and cut ends were observed and found to be hemostatic.  Fallopian tube was returned to abdomen.  Same procedure was performed on the left where the left tube was located, externalized, followed out to its fimbriated end and the knuckle of tube was tied  up twice in the same fashion that was done on the right.  The knuckle of tube was then excised with Metzenbaum scissors and the cut ends were observed and found to be hemostatic.  Tube was also returned to the abdomen.  The fascia was then grasped, reapproximated, and closed with Vicryl in a running fashion.  The skin was reapproximated and closed with 3-0 Monocryl subcuticularly.  The patient tolerated the procedure well.  Sponge, lap, and needle counts were correct x2.  The patient was taken to recovery in stable condition.          ______________________________ Philip Aspen, DO     /MEDQ  D:  11/20/2011  T:  11/21/2011  Job:  [id number]

## 2014-09-17 ENCOUNTER — Encounter (HOSPITAL_COMMUNITY): Payer: Self-pay | Admitting: Obstetrics and Gynecology

## 2015-03-07 ENCOUNTER — Encounter (HOSPITAL_COMMUNITY): Payer: Self-pay | Admitting: Emergency Medicine

## 2015-03-07 ENCOUNTER — Emergency Department (HOSPITAL_COMMUNITY)
Admission: EM | Admit: 2015-03-07 | Discharge: 2015-03-07 | Disposition: A | Discharge status: Home / Self Care | Payer: No Typology Code available for payment source | Attending: Emergency Medicine | Admitting: Emergency Medicine

## 2015-03-07 DIAGNOSIS — Z87440 Personal history of urinary (tract) infections: Secondary | ICD-10-CM | POA: Insufficient documentation

## 2015-03-07 DIAGNOSIS — Y92410 Unspecified street and highway as the place of occurrence of the external cause: Secondary | ICD-10-CM | POA: Insufficient documentation

## 2015-03-07 DIAGNOSIS — S199XXA Unspecified injury of neck, initial encounter: Secondary | ICD-10-CM | POA: Diagnosis not present

## 2015-03-07 DIAGNOSIS — Z720 Tobacco use: Secondary | ICD-10-CM | POA: Insufficient documentation

## 2015-03-07 DIAGNOSIS — F329 Major depressive disorder, single episode, unspecified: Secondary | ICD-10-CM | POA: Diagnosis not present

## 2015-03-07 DIAGNOSIS — Y9389 Activity, other specified: Secondary | ICD-10-CM | POA: Insufficient documentation

## 2015-03-07 DIAGNOSIS — F419 Anxiety disorder, unspecified: Secondary | ICD-10-CM | POA: Insufficient documentation

## 2015-03-07 DIAGNOSIS — Y998 Other external cause status: Secondary | ICD-10-CM | POA: Diagnosis not present

## 2015-03-07 DIAGNOSIS — J45909 Unspecified asthma, uncomplicated: Secondary | ICD-10-CM | POA: Diagnosis not present

## 2015-03-07 DIAGNOSIS — S4992XA Unspecified injury of left shoulder and upper arm, initial encounter: Secondary | ICD-10-CM | POA: Insufficient documentation

## 2015-03-07 MED ORDER — IBUPROFEN 800 MG PO TABS
800.0000 mg | ORAL_TABLET | Freq: Three times a day (TID) | ORAL | Status: AC
Start: 2015-03-07 — End: 2015-03-10

## 2015-03-07 MED ORDER — DIAZEPAM 5 MG PO TABS: 5.0000 mg | ORAL_TABLET | Freq: Two times a day (BID) | ORAL | Status: AC

## 2015-03-07 MED ORDER — TRAMADOL HCL 50 MG PO TABS: 50.0000 mg | ORAL_TABLET | Freq: Four times a day (QID) | ORAL | Status: DC | PRN
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
## 2015-05-21 ENCOUNTER — Ambulatory Visit: Payer: Medicaid Other | Attending: Internal Medicine

## 2015-11-14 ENCOUNTER — Emergency Department (HOSPITAL_COMMUNITY)
Admission: EM | Admit: 2015-11-14 | Discharge: 2015-11-14 | Disposition: A | Discharge status: Home / Self Care | Payer: Medicaid Other | Attending: Emergency Medicine | Admitting: Emergency Medicine

## 2015-11-14 ENCOUNTER — Encounter (HOSPITAL_COMMUNITY): Payer: Self-pay | Admitting: Cardiology

## 2015-11-14 DIAGNOSIS — G43809 Other migraine, not intractable, without status migrainosus: Secondary | ICD-10-CM | POA: Diagnosis not present

## 2015-11-14 DIAGNOSIS — Z8659 Personal history of other mental and behavioral disorders: Secondary | ICD-10-CM | POA: Insufficient documentation

## 2015-11-14 DIAGNOSIS — Z87440 Personal history of urinary (tract) infections: Secondary | ICD-10-CM | POA: Diagnosis not present

## 2015-11-14 DIAGNOSIS — J45909 Unspecified asthma, uncomplicated: Secondary | ICD-10-CM | POA: Insufficient documentation

## 2015-11-14 DIAGNOSIS — R51 Headache: Secondary | ICD-10-CM | POA: Diagnosis present

## 2015-11-14 DIAGNOSIS — R61 Generalized hyperhidrosis: Secondary | ICD-10-CM | POA: Insufficient documentation

## 2015-11-14 DIAGNOSIS — F17200 Nicotine dependence, unspecified, uncomplicated: Secondary | ICD-10-CM | POA: Insufficient documentation

## 2015-11-14 DIAGNOSIS — Z3202 Encounter for pregnancy test, result negative: Secondary | ICD-10-CM | POA: Insufficient documentation

## 2015-11-14 LAB — I-STAT BETA HCG BLOOD, ED (MC, WL, AP ONLY): I-stat hCG, quantitative: <5 m[IU]/mL (ref <5)

## 2015-11-14 MED ORDER — KETOROLAC TROMETHAMINE 30 MG/ML IJ SOLN
30.0000 mg | Freq: Once | INTRAMUSCULAR | Status: AC
  Administered 2015-11-14: 30 mg via INTRAVENOUS
  Filled 2015-11-14: qty 1

## 2015-11-14 MED ORDER — DIPHENHYDRAMINE HCL 50 MG/ML IJ SOLN
25.0000 mg | Freq: Once | INTRAMUSCULAR | Status: AC
  Administered 2015-11-14: 25 mg via INTRAVENOUS
  Filled 2015-11-14: qty 1

## 2015-11-14 MED ORDER — SODIUM CHLORIDE 0.9 % IV BOLUS (SEPSIS)
1000.0000 mL | Freq: Once | INTRAVENOUS | Status: AC
  Administered 2015-11-14: 1000 mL via INTRAVENOUS

## 2015-11-14 MED ORDER — DEXAMETHASONE SODIUM PHOSPHATE 10 MG/ML IJ SOLN
10.0000 mg | Freq: Once | INTRAMUSCULAR | Status: AC
  Administered 2015-11-14: 10 mg via INTRAVENOUS
  Filled 2015-11-14: qty 1

## 2015-11-14 MED ORDER — PROCHLORPERAZINE EDISYLATE 5 MG/ML IJ SOLN
10.0000 mg | Freq: Four times a day (QID) | INTRAMUSCULAR | Status: DC | PRN
  Administered 2015-11-14: 10 mg via INTRAVENOUS
  Filled 2015-11-14: qty 2

## 2015-11-14 NOTE — ED Notes (Signed)
Pt ambulated to bathroom to void, gait steady, pt tolerated well.

## 2015-11-14 NOTE — ED Provider Notes (Signed)
CSN: [id number]     Arrival date & time 11/14/15  4782 History   First MD Initiated Contact with Patient 11/14/15 [phone]     Chief Complaint  Patient presents with  . Migraine     (Consider location/radiation/quality/duration/timing/severity/associated sxs/prior Treatment) Patient is a 34 y.o. female presenting with migraines. The history is provided by the patient.  Migraine This is a recurrent problem. The current episode started today. The problem occurs constantly. The problem has been gradually worsening. Associated symptoms include chills, diaphoresis, headaches, nausea and vomiting. Pertinent negatives include no abdominal pain, anorexia, arthralgias, change in bowel habit, chest pain, congestion, coughing, fatigue, fever, joint swelling, myalgias, neck pain, numbness, rash, sore throat, swollen glands, urinary symptoms, vertigo or weakness. Nothing aggravates the symptoms. She has tried nothing for the symptoms. The treatment provided no relief.    Past Medical History  Diagnosis Date  . Preterm labor   . Headache(784.0)   . Asthma     as child  . Urinary tract infection   . Abnormal Pap smear     x1, rpt wnl  . Anxiety   . Depression     hx of med usuage   Past Surgical History  Procedure Laterality Date  . No past surgeries    . Colposcopy    . Tubal ligation  11/16/2011    Procedure: POST PARTUM TUBAL LIGATION;  Surgeon: Philip Aspen, DO;  Location: [facility];  Service: Gynecology;  Laterality: Bilateral;   Family History  Problem Relation Age of Onset  . Anesthesia problems Neg Hx   . Hypotension Neg Hx   . Malignant hyperthermia Neg Hx   . Pseudochol deficiency Neg Hx    Social History  Substance Use Topics  . Smoking status: Current Every Day Smoker -- 0.25 packs/day for 15 years  . Smokeless tobacco: Never Used  . Alcohol Use: No   OB History    Gravida Para Term Preterm AB TAB SAB Ectopic Multiple Living   0 0 0 0 0 0 2     Review of Systems   Constitutional: Positive for chills and diaphoresis. Negative for fever and fatigue.  HENT: Negative for congestion and sore throat.   Eyes: Positive for photophobia. Negative for pain.  Respiratory: Negative for cough.   Cardiovascular: Negative for chest pain.  Gastrointestinal: Positive for nausea and vomiting. Negative for abdominal pain, diarrhea, constipation, anorexia and change in bowel habit.  Genitourinary: Negative for dysuria, vaginal bleeding, vaginal discharge and difficulty urinating.  Musculoskeletal: Negative for myalgias, joint swelling, arthralgias and neck pain.  Skin: Negative for rash.  Neurological: Positive for headaches. Negative for dizziness, vertigo, facial asymmetry, weakness and numbness.      Allergies  Review of patient's allergies indicates no known allergies.  Home Medications   Prior to Admission medications   Medication Sig Start Date End Date Taking? Authorizing Provider  acetaminophen (TYLENOL) 500 MG tablet Take 500 mg by mouth every 6 (six) hours as needed for mild pain.   Yes Historical Provider, MD   BP 132/102 mmHg  Pulse 92  Temp(Src) 97.8 F (36.6 C) (Oral)  Resp 18  Ht  (1.626 m)  Wt 56.246 kg  BMI 21.27 kg/m2  SpO2 98%  LMP 10/31/2015 Physical Exam  Constitutional: She appears well-developed and well-nourished. She appears distressed.  HENT:  Head: Head is without abrasion, without contusion and without laceration.  Right Ear: Hearing, tympanic membrane, external ear and ear canal normal.  Left Ear:  Hearing, tympanic membrane, external ear and ear canal normal.  Eyes: Conjunctivae and EOM are normal. Pupils are equal, round, and reactive to light.  Neck: Normal range of motion.  Cardiovascular: Normal rate and regular rhythm.  Exam reveals no decreased pulses.   Pulmonary/Chest: Effort normal and breath sounds normal.  Abdominal: Normal appearance. There is no tenderness. There is no rigidity, no rebound, no guarding, no  CVA tenderness, no tenderness at McBurney's point and negative Murphy's sign.  Neurological: She is alert. She has normal strength. No cranial nerve deficit or sensory deficit. Coordination normal. GCS eye subscore is 4. GCS verbal subscore is 5. GCS motor subscore is 6.  Skin: No rash noted.  Psychiatric: Her speech is normal. Cognition and memory are normal.    ED Course  Procedures (including critical care time) Labs Review Labs Reviewed  I-STAT BETA HCG BLOOD, ED ([facility], [facility], [facility] ONLY)    Imaging Review No results found. I have personally reviewed and evaluated these images and lab results as part of my medical decision-making.   EKG Interpretation None      MDM   Final diagnoses:  Other migraine without status migrainosus, not intractable    34 year old Caucasian female with past medical history of migraines presents in setting of headache. Patient reports she woke at 3:00 in the morning and had a generalized headache. She reports she was able to go back to sleep at approximately 4:30 that headache continued. At that time she took Tylenol without relief of headache. She reports headache has been gradually worsening since that that time. She reports some photophobia and 1 bout of nonbloody nonbilious emesis with headache. She additionally reports some chills and diaphoresis. Patient reports this is similar to previous migraines but has not had one in quite some time. Patient has seen a neurologist in the past for this. Patient not currently taking any medications. The patient's last menstrual cycle was approximately 2 weeks ago. Patient denies any head trauma, fevers, chest pain, shortness of breath, recent sick contacts, recent travel, drug or alcohol use.  On examination patient was distressed and had photophobia. No focal neurologic deficit noted. Patient will be given a migraine cocktail including 1 L normal saline bolus, Compazine, diphenhydramine, Decadron. We'll reassess after  medications. Additionally we'll obtain pregnancy test.  Pregnancy test negative. On reassessment after bolus patient reported improvement in symptoms but continued still headache. Patient given Toradol at that time and we'll reassess.  On reassessment patient reported almost complete resolution of headache. She reports this is very similar to previous migraines. She did report decreased by mouth intake yesterday. In setting these findings this is likely migraine headache. We'll discharge patient home with plan to follow with PCP or neurology for further management of migraine headaches. Patient given strict return precautions and stable at time of discharge. Patient in agreement with plan.  Attending has seen and evaluated patient and Dr. Clarene DukeLittle is in agreement with plan.  Stacy GardnerAndrew Jolina Symonds, MD 11/14/15 1237  Laurence Spatesachel Morgan Little, MD 11/14/15 [phone]

## 2015-11-14 NOTE — Discharge Instructions (Signed)
Migraine Headache °A migraine headache is an intense, throbbing pain on one or both sides of your head. A migraine can last for 30 minutes to several hours. °CAUSES  °The exact cause of a migraine headache is not always known. However, a migraine may be caused when nerves in the brain become irritated and release chemicals that cause inflammation. This causes pain. °Certain things may also trigger migraines, such as: °· Alcohol. °· Smoking. °· Stress. °· Menstruation. °· Aged cheeses. °· Foods or drinks that contain nitrates, glutamate, aspartame, or tyramine. °· Lack of sleep. °· Chocolate. °· Caffeine. °· Hunger. °· Physical exertion. °· Fatigue. °· Medicines used to treat chest pain (nitroglycerine), birth control pills, estrogen, and some blood pressure medicines. °SIGNS AND SYMPTOMS °· Pain on one or both sides of your head. °· Pulsating or throbbing pain. °· Severe pain that prevents daily activities. °· Pain that is aggravated by any physical activity. °· Nausea, vomiting, or both. °· Dizziness. °· Pain with exposure to bright lights, loud noises, or activity. °· General sensitivity to bright lights, loud noises, or smells. °Before you get a migraine, you may get warning signs that a migraine is coming (aura). An aura may include: °· Seeing flashing lights. °· Seeing bright spots, halos, or zigzag lines. °· Having tunnel vision or blurred vision. °· Having feelings of numbness or tingling. °· Having trouble talking. °· Having muscle weakness. °DIAGNOSIS  °A migraine headache is often diagnosed based on: °· Symptoms. °· Physical exam. °· A CT scan or MRI of your head. These imaging tests cannot diagnose migraines, but they can help rule out other causes of headaches. °TREATMENT °Medicines may be given for pain and nausea. Medicines can also be given to help prevent recurrent migraines.  °HOME CARE INSTRUCTIONS °· Only take over-the-counter or prescription medicines for pain or discomfort as directed by your  health care provider. The use of long-term narcotics is not recommended. °· Lie down in a dark, quiet room when you have a migraine. °· Keep a journal to find out what may trigger your migraine headaches. For example, write down: °¨ What you eat and drink. °¨ How much sleep you get. °¨ Any change to your diet or medicines. °· Limit alcohol consumption. °· Quit smoking if you smoke. °· Get 7-9 hours of sleep, or as recommended by your health care provider. °· Limit stress. °· Keep lights dim if bright lights bother you and make your migraines worse. °SEEK IMMEDIATE MEDICAL CARE IF:  °· Your migraine becomes severe. °· You have a fever. °· You have a stiff neck. °· You have vision loss. °· You have muscular weakness or loss of muscle control. °· You start losing your balance or have trouble walking. °· You feel faint or pass out. °· You have severe symptoms that are different from your first symptoms. °MAKE SURE YOU:  °· Understand these instructions. °· Will watch your condition. °· Will get help right away if you are not doing well or get worse. °  °This information is not intended to replace advice given to you by your health care provider. Make sure you discuss any questions you have with your health care provider. °  °Document Released: 11/02/2005 Document Revised: 11/23/2014 Document Reviewed: 07/10/2013 °Elsevier Interactive Patient Education ©2016 [employer] ° °Recurrent Migraine Headache °A migraine headache is very bad, throbbing pain on one or both sides of your head. Recurrent migraines keep coming back. Talk to your doctor about what things may bring on (  trigger) your migraine headaches. °HOME CARE °· Only take medicines as told by your doctor. °· Lie down in a dark, quiet room when you have a migraine. °· Keep a journal to find out if certain things bring on migraine headaches. For example, write down: °¨ What you eat and drink. °¨ How much sleep you get. °¨ Any change to your diet or  medicines. °· Lessen how much alcohol you drink. °· Quit smoking if you smoke. °· Get enough sleep. °· Lessen any stress in your life. °· Keep lights dim if bright lights bother you or make your migraines worse. °GET HELP IF: °· Medicine does not help your migraines. °· Your pain keeps coming back. °· You have a fever. °GET HELP RIGHT AWAY IF:  °· Your migraine becomes really bad. °· You have a stiff neck. °· You have trouble seeing. °· Your muscles are weak, or you lose muscle control. °· You lose your balance or have trouble walking. °· You feel like you will pass out (faint), or you pass out. °· You have really bad symptoms that are different than your first symptoms. °MAKE SURE YOU:  °· Understand these instructions. °· Will watch your condition. °· Will get help right away if you are not doing well or get worse. °  °This information is not intended to replace advice given to you by your health care provider. Make sure you discuss any questions you have with your health care provider. °  °Document Released: 08/11/2008 Document Revised: 11/07/2013 Document Reviewed: 07/10/2013 °Elsevier Interactive Patient Education ©2016 [employer] ° °

## 2015-11-14 NOTE — ED Notes (Signed)
Pt reports a severe migraine that started this morning about 3am. States she has a hx of the same and has seen a neurologist in the past. Tried OTC meds this morning without relief.

## 2017-04-23 ENCOUNTER — Emergency Department (HOSPITAL_COMMUNITY)
Admission: EM | Admit: 2017-04-23 | Discharge: 2017-04-23 | Disposition: A | Discharge status: Home / Self Care | Payer: Medicaid Other | Attending: Emergency Medicine | Admitting: Emergency Medicine

## 2017-04-23 ENCOUNTER — Encounter (HOSPITAL_COMMUNITY): Payer: Self-pay | Admitting: *Deleted

## 2017-04-23 DIAGNOSIS — L03116 Cellulitis of left lower limb: Secondary | ICD-10-CM | POA: Insufficient documentation

## 2017-04-23 DIAGNOSIS — F17200 Nicotine dependence, unspecified, uncomplicated: Secondary | ICD-10-CM | POA: Insufficient documentation

## 2017-04-23 DIAGNOSIS — Z79899 Other long term (current) drug therapy: Secondary | ICD-10-CM | POA: Insufficient documentation

## 2017-04-23 DIAGNOSIS — J45909 Unspecified asthma, uncomplicated: Secondary | ICD-10-CM | POA: Insufficient documentation

## 2017-04-23 DIAGNOSIS — R21 Rash and other nonspecific skin eruption: Secondary | ICD-10-CM | POA: Insufficient documentation

## 2017-04-23 MED ORDER — DOXYCYCLINE HYCLATE 100 MG PO CAPS: 100.0000 mg | ORAL_CAPSULE | Freq: Two times a day (BID) | ORAL | 0 refills | Status: DC

## 2017-04-23 MED ORDER — LIDOCAINE-EPINEPHRINE (PF) 2 %-1:200000 IJ SOLN
10.0000 mL | Freq: Once | INTRAMUSCULAR | Status: AC
  Administered 2017-04-23: 10 mL
  Filled 2017-04-23: qty 20

## 2017-04-23 MED ORDER — HYDROCODONE-ACETAMINOPHEN 5-325 MG PO TABS
1.0000 | ORAL_TABLET | Freq: Once | ORAL | Status: AC
  Administered 2017-04-23: 1 via ORAL
  Filled 2017-04-23: qty 1

## 2017-04-23 MED ORDER — OXYCODONE-ACETAMINOPHEN 5-325 MG PO TABS: 1.0000 | ORAL_TABLET | ORAL | 0 refills | Status: DC | PRN

## 2017-04-23 NOTE — ED Triage Notes (Signed)
Pt believes she was bitten by a spider 4 days ago. Pt did not see the spider but states she has been working outside recently.  Pt has skin lesion on left lateral thigh with redness. Pt denies fevers, states she felt nauseas this morning.

## 2017-04-23 NOTE — ED Provider Notes (Signed)
[facility]-EMERGENCY DEPT Provider Note   CSN: [id number] Arrival date & time: 04/23/17  1532  By signing my name below, I, Rosario AdieWilliam Andrew Hiatt, attest that this documentation has been prepared under the direction and in the presence of Sonita Michiels, PA-C.  Electronically Signed: Rosario AdieWilliam Andrew Hiatt, ED Scribe. 04/23/17. 4:28 PM.  History   Chief Complaint Chief Complaint  Patient presents with  . Insect Bite   The history is provided by the patient. No language interpreter was used.    HPI Comments: Katie Marsh is a 36 y.o. female who presents to the Emergency Department complaining of a moderate, gradually worsening area of pain, redness, and swelling to the distal left posterior thigh s/p possible spider bite which occurred four days ago. Per pt, she was working outside over the weekend and then four days ago the area became pruritic and since she has had an increase in redness, pain, and warmth to the area. Pt notes associated nausea this morning. She attempted to lance and drain the area at home; however, the area has worsened despite this. No OTC analgesics were attempted prior to coming into the ED. Her pain is worse with bending the left knee and ambulation. No recent falls or trauma to the extremity. No h/o similar symptoms. She denies fever, chills, vomiting, vision changes, headache, sick contacts, throat swelling, trouble breathing, drooling, or any other associated symptoms.   Past Medical History:  Diagnosis Date  . Abnormal Pap smear    x1, rpt wnl  . Anxiety   . Asthma    as child  . Depression    hx of med usuage  . Headache(784.0)   . Preterm labor   . Urinary tract infection    There are no active problems to display for this patient.  Past Surgical History:  Procedure Laterality Date  . COLPOSCOPY    . NO PAST SURGERIES    . TUBAL LIGATION  11/16/2011   Procedure: POST PARTUM TUBAL LIGATION;  Surgeon: Philip AspenSidney Callahan, DO;  Location: [facility];  Service: Gynecology;   Laterality: Bilateral;   OB History    Gravida Para Term Preterm AB Living   2 2 2  0 0 2   SAB TAB Ectopic Multiple Live Births   0 0 0 0 2     Home Medications    Prior to Admission medications   Medication Sig Start Date End Date Taking? Authorizing Provider  acetaminophen (TYLENOL) 500 MG tablet Take 500 mg by mouth every 6 (six) hours as needed for mild pain.    [provider]  doxycycline (VIBRAMYCIN) 100 MG capsule Take 1 capsule (100 mg total) by mouth 2 (two) times daily. 04/23/17   Jaeden Westbay, PA-C  oxyCODONE-acetaminophen (PERCOCET/ROXICET) 5-325 MG tablet Take 1 tablet by mouth every 4 (four) hours as needed for severe pain. 04/23/17   Dietrich PatesKhatri, Mal[place] Kadlec, PA-C   Family History Family History  Problem Relation Age of Onset  . Anesthesia problems Neg Hx   . Hypotension Neg Hx   . Malignant hyperthermia Neg Hx   . Pseudochol deficiency Neg Hx    Social History Social History  Substance Use Topics  . Smoking status: Current Every Day Smoker    Packs/day: 0.25    Years: 15.00  . Smokeless tobacco: Never Used  . Alcohol use No   Allergies   Patient has no known allergies.  Review of Systems Review of Systems  Constitutional: Negative for chills and fever.  HENT: Negative for drooling.  Respiratory: Negative for choking and shortness of breath.   Cardiovascular: Negative for chest pain.  Gastrointestinal: Positive for nausea. Negative for vomiting.  Musculoskeletal: Positive for myalgias.  Skin: Positive for color change and wound.  Neurological: Negative for headaches.   Physical Exam Updated Vital Signs BP 107/71 (BP Location: Right Arm)   Pulse 91   Temp 98.6 F (37 C) (Oral)   Resp 16   LMP 04/02/2017   SpO2 99%   Physical Exam  Constitutional: She appears well-developed and well-nourished. No distress.  HENT:  Head: Normocephalic and atraumatic.  Eyes: Conjunctivae and EOM are normal. No scleral icterus.  Neck: Normal range of motion.    Pulmonary/Chest: Effort normal. No respiratory distress.  Neurological: She is alert.  Skin: Rash noted. She is not diaphoretic. There is erythema.  Image is of L lateral thigh. Area is severely TTP. No drainage noted. Central area is the result of patient lancing the area at home last night. No bleeding noted. Slight area of fluctuance noted with palpation.  Psychiatric: She has a normal mood and affect.  Nursing note and vitals reviewed.     ED Treatments / Results  DIAGNOSTIC STUDIES: Oxygen Saturation is 99% on RA, normal by my interpretation.   COORDINATION OF CARE: 4:28 PM-Discussed next steps with pt. Pt verbalized understanding and is agreeable with the plan.   Labs (all labs ordered are listed, but only abnormal results are displayed) Labs Reviewed - No data to display  EKG  EKG Interpretation None      Radiology No results found.  Procedures .Marland KitchenIncision and Drainage Date/Time: 04/23/2017 6:09 PM Performed by: Dietrich Pates Authorized by: Dietrich Pates   Consent:    Consent obtained:  Verbal   Consent given by:  Patient   Risks discussed:  Bleeding, incomplete drainage, infection and damage to other organs   Alternatives discussed:  Delayed treatment and no treatment Location:    Type:  Abscess   Size:  3x3cm   Location:  Lower extremity   Lower extremity location:  Leg   Leg location:  L upper leg Pre-procedure details:    Skin preparation:  Betadine Sedation:    Sedation type: none. Anesthesia (see MAR for exact dosages):    Anesthesia method:  Local infiltration   Local anesthetic:  Lidocaine 2% WITH epi Procedure type:    Complexity:  Simple Procedure details:    Needle aspiration: no     Incision types:  Cruciate   Incision depth:  Subcutaneous   Scalpel blade:  11   Wound management:  Irrigated with saline and extensive cleaning   Drainage:  Bloody   Drainage amount:  Scant   Wound treatment:  Wound left open   Packing materials:   None Post-procedure details:    Patient tolerance of procedure:  Tolerated well, no immediate complications      Medications Ordered in ED Medications  lidocaine-EPINEPHrine (XYLOCAINE W/EPI) 2 %-1:200000 (PF) injection 10 mL (10 mLs Infiltration Given by Other 04/23/17 1722)  HYDROcodone-acetaminophen (NORCO/VICODIN) 5-325 MG per tablet 1 tablet (1 tablet Oral Given 04/23/17 1721)   Initial Impression / Assessment and Plan / ED Course  I have reviewed the triage vital signs and the nursing notes.  Pertinent labs & imaging results that were available during my care of the patient were reviewed by me and considered in my medical decision making (see chart for details).     Patient with possible spider bite and development of rash and possible abscess. Patient  unsure of what bit her. No meds PTA. No signs of severe allergic reaction as patient is tolerating secretions and is not in respiatory distress. Patient afebrile with no history of fever.  Ultrasound probing revealed fluid that could be drainable. I&D resulted in scant bloody drainage although squeezing. Areas left open for healing. Encouraged home warm soaks and flushing.   Mild signs of cellulitis is surrounding skin.  Will d/c to home with Doxycycline and short course of pain medication to prevent any further infection to cover for both MRSA (although patient low risk) and any tick borne illnesses as patient is unsure what is causing this. Use Benadryl as needed. Discouraged patient from trying I&D at home in the future due to increased risk of infection. Pt is comfortable with above plan and is stable for discharge at this time. All questions were answered prior to disposition. Strict return precautions for return into the ED were discussed.    Patient discussed with Dr. Eudelia Bunch.  Final Clinical Impressions(s) / ED Diagnoses   Final diagnoses:  Cellulitis of left lower extremity  Rash   New Prescriptions Discharge Medication List  as of 04/23/2017  6:06 PM    START taking these medications   Details  doxycycline (VIBRAMYCIN) 100 MG capsule Take 1 capsule (100 mg total) by mouth 2 (two) times daily., Starting Fri 04/23/2017, Print    oxyCODONE-acetaminophen (PERCOCET/ROXICET) 5-325 MG tablet Take 1 tablet by mouth every 4 (four) hours as needed for severe pain., Starting Fri 04/23/2017, Print       I personally performed the services described in this documentation, which was scribed in my presence. The recorded information has been reviewed and is accurate.     Dietrich Pates, PA-C 04/24/17 1333    Nira Conn, MD 04/24/17 [id number]

## 2017-04-23 NOTE — Discharge Instructions (Signed)
Take doxycycline twice daily for 10 days. Take pain medication as needed. Keep the area open and clean as directed. Return to ED for worsening pain, increased drainage, injury, fever, increased swelling, increased bleeding, inability to walk.

## 2018-04-02 ENCOUNTER — Emergency Department (HOSPITAL_COMMUNITY)
Admission: EM | Admit: 2018-04-02 | Discharge: 2018-04-02 | Disposition: A | Discharge status: Home / Self Care | Payer: Self-pay | Attending: Emergency Medicine | Admitting: Emergency Medicine

## 2018-04-02 ENCOUNTER — Other Ambulatory Visit: Payer: Self-pay

## 2018-04-02 ENCOUNTER — Encounter (HOSPITAL_COMMUNITY): Payer: Self-pay

## 2018-04-02 DIAGNOSIS — Y999 Unspecified external cause status: Secondary | ICD-10-CM | POA: Insufficient documentation

## 2018-04-02 DIAGNOSIS — Y93H2 Activity, gardening and landscaping: Secondary | ICD-10-CM | POA: Insufficient documentation

## 2018-04-02 DIAGNOSIS — W228XXA Striking against or struck by other objects, initial encounter: Secondary | ICD-10-CM | POA: Insufficient documentation

## 2018-04-02 DIAGNOSIS — S0501XA Injury of conjunctiva and corneal abrasion without foreign body, right eye, initial encounter: Secondary | ICD-10-CM | POA: Insufficient documentation

## 2018-04-02 DIAGNOSIS — Y92017 Garden or yard in single-family (private) house as the place of occurrence of the external cause: Secondary | ICD-10-CM | POA: Insufficient documentation

## 2018-04-02 DIAGNOSIS — Z23 Encounter for immunization: Secondary | ICD-10-CM | POA: Insufficient documentation

## 2018-04-02 DIAGNOSIS — F17200 Nicotine dependence, unspecified, uncomplicated: Secondary | ICD-10-CM | POA: Insufficient documentation

## 2018-04-02 MED ORDER — TETANUS-DIPHTH-ACELL PERTUSSIS 5-2.5-18.5 LF-MCG/0.5 IM SUSP
0.5000 mL | Freq: Once | INTRAMUSCULAR | Status: AC
  Administered 2018-04-02: 0.5 mL via INTRAMUSCULAR
  Filled 2018-04-02: qty 0.5

## 2018-04-02 MED ORDER — TETRACAINE HCL 0.5 % OP SOLN
2.0000 [drp] | Freq: Once | OPHTHALMIC | Status: AC
  Administered 2018-04-02: 2 [drp] via OPHTHALMIC
  Filled 2018-04-02: qty 4

## 2018-04-02 MED ORDER — ERYTHROMYCIN 5 MG/GM OP OINT: 1.0000 "application " | TOPICAL_OINTMENT | Freq: Four times a day (QID) | OPHTHALMIC | 0 refills | Status: DC

## 2018-04-02 MED ORDER — FLUORESCEIN SODIUM 1 MG OP STRP
1.0000 | ORAL_STRIP | Freq: Once | OPHTHALMIC | Status: AC
  Administered 2018-04-02: 1 via OPHTHALMIC
  Filled 2018-04-02: qty 1

## 2018-04-02 NOTE — Discharge Instructions (Signed)
Try to avoid rubbing eyes. Apply warm or cool compresses and use prescribed eye ointments as directed. Alternate between tylenol and motrin as needed for pain. Follow up with the [facility] in 2-3 days for recheck of symptoms and to establish medical care. Return to the ER for changes or worsening symptoms.

## 2018-04-02 NOTE — ED Notes (Signed)
Bed: WTR9 Expected date:  Expected time:  Means of arrival:  Comments: 

## 2018-04-02 NOTE — ED Provider Notes (Signed)
[facility]-EMERGENCY DEPT Provider Note   CSN: [id number] Arrival date & time: 04/02/18  1139     History   Chief Complaint Chief Complaint  Patient presents with  . Eye Injury    HPI Katie Marsh is a 37 y.o. female with a PMHx of asthma, depression, headaches, and anxiety, who presents to the ED with complaints of right eye pain after something went into her eye while she was weed eating about 3 hours ago.  She reports having right eye pain, redness, foreign body sensation, and some blurry vision out of the right eye which has improved since onset.  She has not tried anything for symptoms, no known aggravating factors.  She denies any eye drainage or any other injuries or complaints at this time.  She does not wear contact lenses.  She is not sure when her last tetanus shot was.  NKDA.  Doesn't have a PCP at this time due to lack of insurance.  Also doesn't have an eye specialist she sees.   The history is provided by the patient and medical records. No language interpreter was used.  Eye Injury     Past Medical History:  Diagnosis Date  . Abnormal Pap smear    x1, rpt wnl  . Anxiety   . Asthma    as child  . Depression    hx of med usuage  . Headache(784.0)   . Preterm labor   . Urinary tract infection     There are no active problems to display for this patient.   Past Surgical History:  Procedure Laterality Date  . COLPOSCOPY    . TUBAL LIGATION  11/16/2011   Procedure: POST PARTUM TUBAL LIGATION;  Surgeon: Philip Aspen, DO;  Location: [facility];  Service: Gynecology;  Laterality: Bilateral;     OB History    Gravida  2   Para  2   Term  2   Preterm  0   AB  0   Living  2     SAB  0   TAB  0   Ectopic  0   Multiple  0   Live Births  2            Home Medications    Prior to Admission medications   Medication Sig Start Date End Date Taking? Authorizing Provider  acetaminophen (TYLENOL) 500 MG tablet Take 500 mg  by mouth every 6 (six) hours as needed for mild pain.    [provider]  doxycycline (VIBRAMYCIN) 100 MG capsule Take 1 capsule (100 mg total) by mouth 2 (two) times daily. 04/23/17   Khatri, Hina, PA-C  oxyCODONE-acetaminophen (PERCOCET/ROXICET) 5-325 MG tablet Take 1 tablet by mouth every 4 (four) hours as needed for severe pain. 04/23/17   Dietrich Pates, PA-C    Family History Family History  Problem Relation Age of Onset  . Anesthesia problems Neg Hx   . Hypotension Neg Hx   . Malignant hyperthermia Neg Hx   . Pseudochol deficiency Neg Hx     Social History Social History   Tobacco Use  . Smoking status: Current Every Day Smoker    Packs/day: 0.25    Years: 15.00    Pack years: 3.75  . Smokeless tobacco: Never Used  Substance Use Topics  . Alcohol use: No  . Drug use: No     Allergies   Patient has no known allergies.   Review of Systems Review of Systems  Eyes: Positive for pain, redness and visual disturbance (blurry vision R eye). Negative for discharge.  Allergic/Immunologic: Negative for immunocompromised state.     Physical Exam Updated Vital Signs BP 112/73 (BP Location: Right Arm)   Pulse 81   Temp 98.4 F (36.9 C) (Oral)   Resp 18   Ht  (1.6 m)   Wt 50.8 kg (112 lb)   LMP 03/03/2018   SpO2 98%   BMI 19.84 kg/m   Physical Exam  Constitutional: She is oriented to person, place, and time. Vital signs are normal. She appears well-developed and well-nourished.  Non-toxic appearance. No distress.  Afebrile, nontoxic, NAD  HENT:  Head: Normocephalic and atraumatic.  Mouth/Throat: Mucous membranes are normal.  Eyes: Pupils are equal, round, and reactive to light. EOM are normal. Lids are everted and swept, no foreign bodies found. Right eye exhibits no discharge. Left eye exhibits no discharge. Right conjunctiva is injected. Right conjunctiva has no hemorrhage. Left conjunctiva is not injected. Left conjunctiva has no hemorrhage.  Slit lamp  exam:      The right eye shows corneal abrasion and fluorescein uptake. The right eye shows no foreign body and no hyphema.  PERRL, EOMI, no nystagmus. Mild photophobia in R eye but no consensual eye pain or photophobia to L eye. Lids everted and swept with no FBs noted under R eyelids. Fluorescein staining shows small area of uptake to the upper medial section of the cornea where there is a small linear corneal abrasion. Neg seidel's sign. Conjunctiva injected but no hemorrhage. No hyphema.   Visual Acuity Right Eye Distance: 20/30 Left Eye Distance: 20/25 Bilateral Distance: 20/30  Neck: Normal range of motion. Neck supple.  Cardiovascular: Normal rate and intact distal pulses.  Pulmonary/Chest: Effort normal. No respiratory distress.  Abdominal: Normal appearance. She exhibits no distension.  Musculoskeletal: Normal range of motion.  Neurological: She is alert and oriented to person, place, and time. She has normal strength. No sensory deficit.  Skin: Skin is warm, dry and intact. No rash noted.  Psychiatric: She has a normal mood and affect. Her behavior is normal.  Nursing note and vitals reviewed.    ED Treatments / Results  Labs (all labs ordered are listed, but only abnormal results are displayed) Labs Reviewed - No data to display  EKG None  Radiology No results found.  Procedures Procedures (including critical care time)  Medications Ordered in ED Medications  fluorescein ophthalmic strip 1 strip (1 strip Right Eye Given by Other 04/02/18 1250)  tetracaine (PONTOCAINE) 0.5 % ophthalmic solution 2 drop (2 drops Right Eye Given 04/02/18 1250)  Tdap (BOOSTRIX) injection 0.5 mL (0.5 mLs Intramuscular Given 04/02/18 1425)     Initial Impression / Assessment and Plan / ED Course  I have reviewed the triage vital signs and the nursing notes.  Pertinent labs & imaging results that were available during my care of the patient were reviewed by me and considered in my medical  decision making (see chart for details).     37 y.o. female here with R eye pain and redness after something flew into her eye during yard work. On exam, fluorescein uptake in small abrasion to upper section of the cornea, visual acuity preserved, neg seidel's sign, no FBs underneath eyelids. Will send home with erythromycin ointment, advised OTC remedies for symptomatic relief. Will update Tdap today. Advised f/up with ophthalmology however pt has no insurance and pays out of pocket, requests alternative; discussed that we could have her  f/up with [facility] to establish medical care and f/up the corneal abrasion but if any complications arise she may need to be referred to specialist at that point, which she understands and agrees with. Will give her info for [facility] for now, and urged proper f/up with them in 2-3 days. I explained the diagnosis and have given explicit precautions to return to the ER including for any other new or worsening symptoms. The patient understands and accepts the medical plan as it's been dictated and I have answered their questions. Discharge instructions concerning home care and prescriptions have been given. The patient is STABLE and is discharged to home in good condition.    Final Clinical Impressions(s) / ED Diagnoses   Final diagnoses:  Abrasion of right cornea, initial encounter    ED Discharge Orders        Ordered    erythromycin ophthalmic ointment  4 times daily     04/02/18 [address] [place], [place] 04/02/18 1432    Loren Racer, MD 04/03/18 [phone]

## 2018-04-02 NOTE — ED Triage Notes (Signed)
Pt was working in yard with weed eater. Something struck right eye.  Vision is blurred and painful.

## 2018-12-11 NOTE — Progress Notes (Signed)
Katie Marsh [address] [place], [place] [address] Phone: [phone] Subjective:    I Katie Marsh am serving as a Neurosurgeon for Dr. Antoine Primas.    CC: Right shoulder pain  BMW:[id number]  Katie Marsh is a 38 y.o. female coming in with complaint of right shoulder pain. Has carpel tunnel. Sharp pain that radiates to the shoulder blades and elbow. Limited ROM due to pain.   Onset- Chronic  Location-anterior and superior shoulder right side Duration-chronic for many months Character- Sharp, dull, achy Aggravating factors-  Reliving factors-pain medication from another provider Therapies tried- Oral meds  Severity-needed 10     Past Medical History:  Diagnosis Date  . Abnormal Pap smear    x1, rpt wnl  . Anxiety   . Asthma    as child  . Depression    hx of med usuage  . Headache(784.0)   . Preterm labor   . Urinary tract infection    Past Surgical History:  Procedure Laterality Date  . COLPOSCOPY    . TUBAL LIGATION  11/16/2011   Procedure: POST PARTUM TUBAL LIGATION;  Surgeon: Philip Aspen, DO;  Location: [facility];  Service: Gynecology;  Laterality: Bilateral;   Social History   Socioeconomic History  . Marital status: Single    Spouse name: Not on file  . Number of children: Not on file  . Years of education: Not on file  . Highest education level: Not on file  Occupational History  . Not on file  Social Needs  . Financial resource strain: Not on file  . Food insecurity:    Worry: Not on file    Inability: Not on file  . Transportation needs:    Medical: Not on file    Non-medical: Not on file  Tobacco Use  . Smoking status: Current Every Day Smoker    Packs/day: 0.25    Years: 15.00    Pack years: 3.75  . Smokeless tobacco: Never Used  Substance and Sexual Activity  . Alcohol use: No  . Drug use: No  . Sexual activity: Yes  Lifestyle  . Physical activity:    Days per week: Not on file    Minutes per session:  Not on file  . Stress: Not on file  Relationships  . Social connections:    Talks on phone: Not on file    Gets together: Not on file    Attends religious service: Not on file    Active member of club or organization: Not on file    Attends meetings of clubs or organizations: Not on file    Relationship status: Not on file  Other Topics Concern  . Not on file  Social History Narrative  . Not on file   No Known Allergies Family History  Problem Relation Age of Onset  . Anesthesia problems Neg Hx   . Hypotension Neg Hx   . Malignant hyperthermia Neg Hx   . Pseudochol deficiency Neg Hx     Current Outpatient Medications (Endocrine & Metabolic):  .  predniSONE (DELTASONE) 50 MG tablet, Take 1 tablet (50 mg total) by mouth daily.    Current Outpatient Medications (Analgesics):  .  acetaminophen (TYLENOL) 500 MG tablet, Take 500 mg by mouth every 6 (six) hours as needed for mild pain. Marland Kitchen  oxyCODONE-acetaminophen (PERCOCET/ROXICET) 5-325 MG tablet, Take 1 tablet by mouth every 4 (four) hours as needed for severe pain.   Current Outpatient Medications (Other):  .  doxycycline (VIBRAMYCIN) 100 MG capsule, Take 1 capsule (100 mg total) by mouth 2 (two) times daily. Marland Kitchen.  erythromycin ophthalmic ointment, Place 1 application into the right eye 4 (four) times daily. Apply thin 1/2 inch ribbon to lower eyelid every 6 hours x 7 days .  gabapentin (NEURONTIN) 100 MG capsule, Take 2 capsules (200 mg total) by mouth at bedtime.    Past medical history, social, surgical and family history all reviewed in electronic medical record.  No pertanent information unless stated regarding to the chief complaint.   Review of Systems:  No headache, visual changes, nausea, vomiting, diarrhea, constipation, dizziness, abdominal pain, skin rash, fevers, chills, night sweats, weight loss, swollen lymph nodes, body aches, joint swelling, muscle aches, chest pain, shortness of breath, mood changes.    Objective  Blood pressure 106/62, pulse 75, height 5\' 3"  (1.6 m), weight 115 lb (52.2 kg), last menstrual period 12/08/2018, SpO2 98 %, unknown if currently breastfeeding.   General: No apparent distress alert and oriented x3 mood and affect normal, dressed appropriately.  HEENT: Pupils equal, extraocular movements intact  Respiratory: Patient's speak in full sentences and does not appear short of breath  Cardiovascular: No lower extremity edema, non tender, no erythema  Skin: Warm dry intact with no signs of infection or rash on extremities or on axial skeleton.  Abdomen: Soft nontender  Neuro: Cranial nerves II through XII are intact, neurovascularly intact in all extremities with 2+ DTRs and 2+ pulses.  Lymph: No lymphadenopathy of posterior or anterior cervical chain or axillae bilaterally.  Gait normal with good balance and coordination.  MSK:  Non tender with full range of motion and good stability and symmetric strength and tone of shoulders, elbows, wrist, hip, knee and ankles bilaterally.  S shoulder: Right Inspection reveals no abnormalities, atrophy or asymmetry. Palpation is normal with no tenderness over AC joint or bicipital groove. ROM is full in all planes passively. Rotator cuff strength normal throughout. signs of impingement with positive Neer and Hawkin's tests, but negative empty can sign. Speeds and Yergason's tests normal. No labral pathology noted with negative Obrien's, negative clunk and good stability. Normal scapular function observed. No painful arc and no drop arm sign. No apprehension sign  MSK US performed of: Right This study was ordered, performed, and interpreted by Terrilee FilesZach  D.O.  Shoulder:   Supraspinatus:  Appears normal on long and transverse views, Bursal bulge seen with shoulder abduction on impingement view. Infraspinatus:  Appears normal on long and transverse views. Significant increase in Doppler flow Subscapularis:  Appears normal on  long and transverse views. Positive bursa Teres Minor:  Appears normal on long and transverse views. AC joint:  Capsule undistended, no geyser sign. Glenohumeral Joint:  Appears normal without effusion. Glenoid Labrum:  Intact without visualized tears. Biceps Tendon:  Appears normal on long and transverse views, no fraying of tendon, tendon located in intertubercular groove, no subluxation with shoulder internal or external rotation.  Impression: Subacromial bursitis mild  [phone]; 15 additional minutes spent for Therapeutic exercises as stated in above notes.  This included exercises focusing on stretching, strengthening, with significant focus on eccentric aspects.   Long term goals include an improvement in range of motion, strength, endurance as well as avoiding reinjury. Patient's frequency would include in 1-2 times a day, 3-5 times a week for a duration of 6-12 weeks. Shoulder Exercises that included:  Basic scapular stabilization to include adduction and depression of scapula Scaption, focusing on proper movement and good control  Internal and External rotation utilizing a theraband, with elbow tucked at side entire time Rows with theraband  Proper technique shown and discussed handout in great detail with ATC.  All questions were discussed and answered.     Impression and Recommendations:     This case required medical decision making of moderate complexity. The above documentation has been reviewed and is accurate and complete Judi Saa, DO       Note: This dictation was prepared with Dragon dictation along with smaller phrase technology. Any transcriptional errors that result from this process are unintentional.

## 2018-12-12 ENCOUNTER — Ambulatory Visit (INDEPENDENT_AMBULATORY_CARE_PROVIDER_SITE_OTHER): Payer: Commercial Managed Care - PPO | Admitting: Family Medicine

## 2018-12-12 ENCOUNTER — Ambulatory Visit (INDEPENDENT_AMBULATORY_CARE_PROVIDER_SITE_OTHER)
Admission: RE | Admit: 2018-12-12 | Discharge: 2018-12-12 | Disposition: A | Discharge status: Home / Self Care | Payer: Commercial Managed Care - PPO | Source: Ambulatory Visit | Attending: Family Medicine | Admitting: Family Medicine

## 2018-12-12 ENCOUNTER — Encounter: Payer: Self-pay | Admitting: Family Medicine

## 2018-12-12 ENCOUNTER — Ambulatory Visit: Payer: Self-pay

## 2018-12-12 ENCOUNTER — Encounter

## 2018-12-12 VITALS — BP 106/62 | HR 75 | Ht 63.0 in | Wt 115.0 lb

## 2018-12-12 DIAGNOSIS — G8929 Other chronic pain: Secondary | ICD-10-CM

## 2018-12-12 DIAGNOSIS — M7551 Bursitis of right shoulder: Secondary | ICD-10-CM | POA: Diagnosis not present

## 2018-12-12 DIAGNOSIS — M25511 Pain in right shoulder: Principal | ICD-10-CM

## 2018-12-12 MED ORDER — GABAPENTIN 100 MG PO CAPS
200.0000 mg | ORAL_CAPSULE | Freq: Every day | ORAL | 3 refills | Status: DC
Start: 2018-12-12 — End: 2019-03-13

## 2018-12-12 MED ORDER — PREDNISONE 50 MG PO TABS: 50.0000 mg | ORAL_TABLET | Freq: Every day | ORAL | 0 refills | Status: DC

## 2018-12-12 NOTE — Assessment & Plan Note (Signed)
On ultrasound seems to be mild.  Patient is having radicular symptoms.  Cervical radiculopathy is within the differential.  We discussed icing regimen and home exercise, topical anti-inflammatories, which activities to do which wants to avoid.  Patient is to increase activity slowly over the course the next several days.  Gabapentin given to help at night as well as short course of prednisone follow-up with me again in 4 to 6 weeks for further evaluation and treatment.

## 2018-12-12 NOTE — Patient Instructions (Addendum)
Good to see you  xrays downstairs Keep hands within peripheral vision  Ice 20 minutes 2 times daily. Usually after activity and before bed. Exercises 3 times a week.  Gabapentin 200mg  at night Prednisone daily for 5 days to help decrease inflammation  Over the counter get 2000IU daily  See me again  in 4-6 weeks to make sure you are doing way better

## 2019-01-14 NOTE — Progress Notes (Signed)
[facility] [address] [place], [place] [address] Phone: [phone] Subjective:    I Katie Marsh am serving as a Neurosurgeon for Dr. Antoine Primas.   I'm seeing this patient by the request  of:    CC: Neck and shoulder pain follow-up  RRN:[id number]   12/12/2018 On ultrasound seems to be mild.  Patient is having radicular symptoms.  Cervical radiculopathy is within the differential.  We discussed icing regimen and home exercise, topical anti-inflammatories, which activities to do which wants to avoid.  Patient is to increase activity slowly over the course the next several days.  Gabapentin given to help at night as well as short course of prednisone follow-up with me again in 4 to 6 weeks for further evaluation and treatment.  Updated 01/16/2019 Katie Marsh is a 38 y.o. female coming in with complaint of neck and right shoulder pain. States that her shoulder is sore. Has been cleaning. States her fingers lock up. Carpal tunnel.  Patient states that it is intermittent.  Taking the gabapentin noticing improvement at night.  Prednisone was helpful.   Shoulder x-rays taken December 12, 2018 were independently visualized by me and were unremarkable.  X-rays of the neck showed minimal degenerative changes but otherwise fairly unremarkable previous ultrasounds that show mild subacromial bursitis of the shoulder  Past Medical History:  Diagnosis Date  . Abnormal Pap smear    x1, rpt wnl  . Anxiety   . Asthma    as child  . Depression    hx of med usuage  . Headache(784.0)   . Preterm labor   . Urinary tract infection    Past Surgical History:  Procedure Laterality Date  . COLPOSCOPY    . TUBAL LIGATION  11/16/2011   Procedure: POST PARTUM TUBAL LIGATION;  Surgeon: Philip Aspen, DO;  Location: [facility];  Service: Gynecology;  Laterality: Bilateral;   Social History   Socioeconomic History  . Marital status: Single    Spouse name: Not on file  . Number  of children: Not on file  . Years of education: Not on file  . Highest education level: Not on file  Occupational History  . Not on file  Social Needs  . Financial resource strain: Not on file  . Food insecurity:    Worry: Not on file    Inability: Not on file  . Transportation needs:    Medical: Not on file    Non-medical: Not on file  Tobacco Use  . Smoking status: Current Every Day Smoker    Packs/day: 0.25    Years: 15.00    Pack years: 3.75  . Smokeless tobacco: Never Used  Substance and Sexual Activity  . Alcohol use: No  . Drug use: No  . Sexual activity: Yes  Lifestyle  . Physical activity:    Days per week: Not on file    Minutes per session: Not on file  . Stress: Not on file  Relationships  . Social connections:    Talks on phone: Not on file    Gets together: Not on file    Attends religious service: Not on file    Active member of club or organization: Not on file    Attends meetings of clubs or organizations: Not on file    Relationship status: Not on file  Other Topics Concern  . Not on file  Social History Narrative  . Not on file   No Known Allergies Family  History  Problem Relation Age of Onset  . Anesthesia problems Neg Hx   . Hypotension Neg Hx   . Malignant hyperthermia Neg Hx   . Pseudochol deficiency Neg Hx     Current Outpatient Medications (Endocrine & Metabolic):  .  predniSONE (DELTASONE) 50 MG tablet, Take 1 tablet (50 mg total) by mouth daily. (Patient not taking: Reported on 01/16/2019)    Current Outpatient Medications (Analgesics):  .  acetaminophen (TYLENOL) 500 MG tablet, Take 500 mg by mouth every 6 (six) hours as needed for mild pain. Marland Kitchen  oxyCODONE-acetaminophen (PERCOCET/ROXICET) 5-325 MG tablet, Take 1 tablet by mouth every 4 (four) hours as needed for severe pain. (Patient not taking: Reported on 01/16/2019)   Current Outpatient Medications (Other):  .  gabapentin (NEURONTIN) 100 MG capsule, Take 2 capsules (200 mg total)  by mouth at bedtime. Marland Kitchen  doxycycline (VIBRAMYCIN) 100 MG capsule, Take 1 capsule (100 mg total) by mouth 2 (two) times daily. (Patient not taking: Reported on 01/16/2019) .  erythromycin ophthalmic ointment, Place 1 application into the right eye 4 (four) times daily. Apply thin 1/2 inch ribbon to lower eyelid every 6 hours x 7 days (Patient not taking: Reported on 01/16/2019)    Past medical history, social, surgical and family history all reviewed in electronic medical record.  No pertanent information unless stated regarding to the chief complaint.   Review of Systems:  No headache, visual changes, nausea, vomiting, diarrhea, constipation, dizziness, abdominal pain, skin rash, fevers, chills, night sweats, weight loss, swollen lymph nodes, body aches, joint swelling, muscle aches, chest pain, shortness of breath, mood changes.   Objective  Blood pressure 122/80, pulse 93, height 5\' 3"  (1.6 m), weight 123 lb (55.8 kg), SpO2 98 %, unknown if currently breastfeeding.   General: No apparent distress alert and oriented x3 mood and affect normal, dressed appropriately.  HEENT: Pupils equal, extraocular movements intact  Respiratory: Patient's speak in full sentences and does not appear short of breath  Cardiovascular: No lower extremity edema, non tender, no erythema  Skin: Warm dry intact with no signs of infection or rash on extremities or on axial skeleton.  Abdomen: Soft nontender  Neuro: Cranial nerves II through XII are intact, neurovascularly intact in all extremities with 2+ DTRs and 2+ pulses.  Lymph: No lymphadenopathy of posterior or anterior cervical chain or axillae bilaterally.  Gait normal with good balance and coordination.  MSK:  Non tender with full range of motion and good stability and symmetric strength and tone of  elbows, wrist, hip, knee and ankles bilaterally.  Shoulder: Inspection reveals no abnormalities, atrophy or asymmetry. Palpation is normal with no tenderness over  AC joint or bicipital groove. ROM is full in all planes. Rotator cuff strength normal throughout. N mild impingement with Neer and Hawkins on the right. Speeds and Yergason's tests normal. No labral pathology noted with negative Obrien's, negative clunk and good stability. Normal scapular function observed. No painful arc and no drop arm sign. No apprehension sign  Neck: Inspection loss of lordosis. No palpable stepoffs. Negative Spurling's maneuver. Mild limitation in sidebending to the right and rotation to the right Grip strength and sensation normal in bilateral hands Strength good C4 to T1 distribution No sensory change to C4 to T1 Negative Hoffman sign bilaterally Reflexes normal Tightness of the right trapezius    Impression and Recommendations:      The above documentation has been reviewed and is accurate and complete Judi Saa, DO  Note: This dictation was prepared with Dragon dictation along with smaller phrase technology. Any transcriptional errors that result from this process are unintentional.

## 2019-01-16 ENCOUNTER — Ambulatory Visit (INDEPENDENT_AMBULATORY_CARE_PROVIDER_SITE_OTHER): Payer: Commercial Managed Care - PPO | Admitting: Family Medicine

## 2019-01-16 DIAGNOSIS — M7551 Bursitis of right shoulder: Secondary | ICD-10-CM | POA: Diagnosis not present

## 2019-01-16 NOTE — Assessment & Plan Note (Signed)
Doing much better at this time.  Discussed icing regimen and home exercise.  Discussed ergonomics throughout the day.  Follow-up again 6 weeks if not improved.

## 2019-01-16 NOTE — Patient Instructions (Signed)
Good to see you  Ice is your friend pennsaid pinkie amount topically 2 times daily as needed.  Keep hands within peripheral vision  See me again in 6-8 weeks to make sure you are perfect

## 2019-02-27 ENCOUNTER — Ambulatory Visit: Payer: Commercial Managed Care - PPO | Admitting: Family Medicine

## 2019-03-06 ENCOUNTER — Ambulatory Visit: Payer: Commercial Managed Care - PPO | Admitting: Nurse Practitioner

## 2019-03-10 ENCOUNTER — Other Ambulatory Visit: Payer: Self-pay | Admitting: Family Medicine

## 2019-07-21 ENCOUNTER — Other Ambulatory Visit: Payer: Self-pay | Admitting: Family Medicine

## 2020-09-25 ENCOUNTER — Other Ambulatory Visit: Payer: Self-pay | Admitting: Family Medicine

## 2020-09-25 DIAGNOSIS — M4722 Other spondylosis with radiculopathy, cervical region: Secondary | ICD-10-CM

## 2020-09-25 DIAGNOSIS — R202 Paresthesia of skin: Secondary | ICD-10-CM

## 2020-10-09 ENCOUNTER — Other Ambulatory Visit: Payer: Self-pay

## 2020-10-09 ENCOUNTER — Ambulatory Visit
Admission: RE | Admit: 2020-10-09 | Discharge: 2020-10-09 | Disposition: A | Discharge status: Home / Self Care | Payer: Commercial Managed Care - PPO | Source: Ambulatory Visit | Attending: Family Medicine | Admitting: Family Medicine

## 2020-10-09 DIAGNOSIS — M4722 Other spondylosis with radiculopathy, cervical region: Secondary | ICD-10-CM

## 2020-10-09 DIAGNOSIS — R202 Paresthesia of skin: Secondary | ICD-10-CM

## 2020-10-15 ENCOUNTER — Other Ambulatory Visit: Payer: Commercial Managed Care - PPO

## 2021-04-13 IMAGING — MR MR CERVICAL SPINE W/O CM
4 of 5 series · 28 of 48 positions shown · non-contrast
Comparison: 12/12/2018 cervical spine radiographs.

CLINICAL DATA: Arm/hand numbness and tingling. Cervical
radiculopathy.

EXAM:
MRI CERVICAL SPINE WITHOUT CONTRAST
TECHNIQUE: Multiplanar, multisequence MR imaging of the cervical spine was
performed. No intravenous contrast was administered.

[Series 5: T2 · sagittal · 3.0mm · 0.55mm/px · 6 of 15 slices shown (1 of 2)]
[im 1/15]
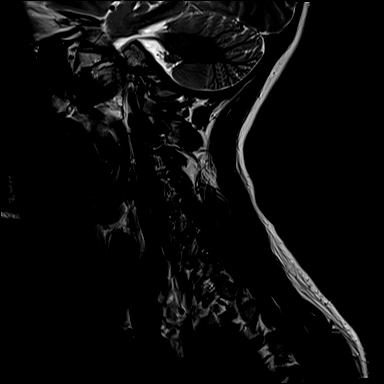
[im 3/15]
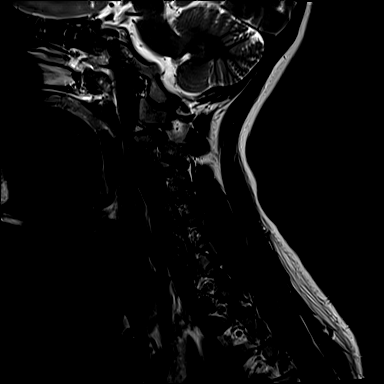
[im 6/15]
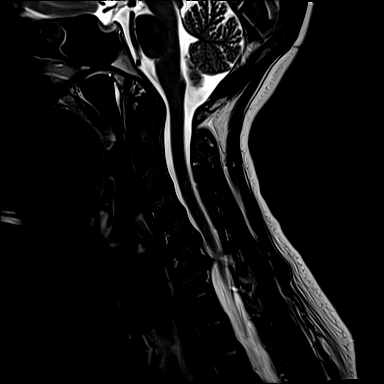
[im 9/15]
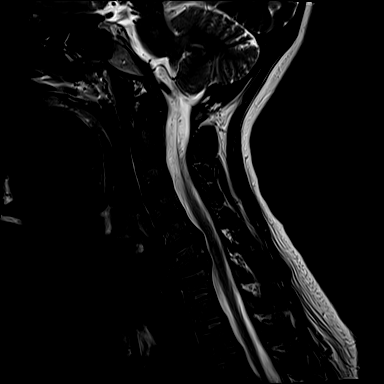
[im 12/15]
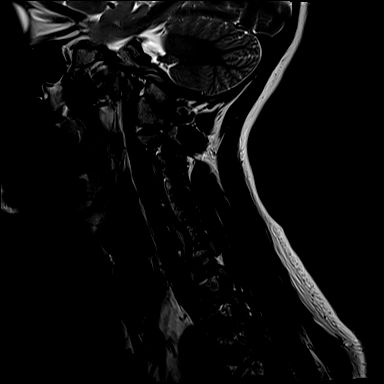
[im 15/15]
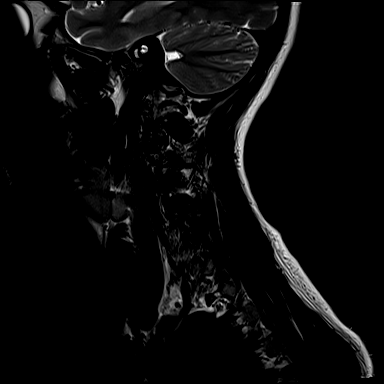

[Series 6: T1 · sagittal · 3.0mm · 0.82mm/px · 7 of 15 slices shown]
[im 1/15]
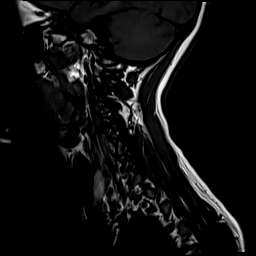
[im 3/15]
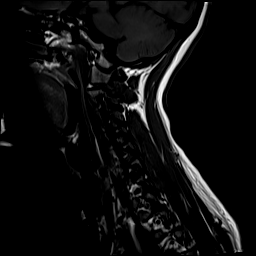
[im 5/15]
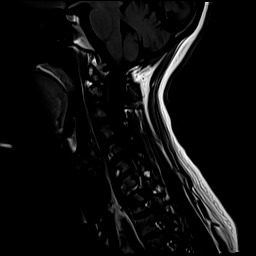
[im 8/15]
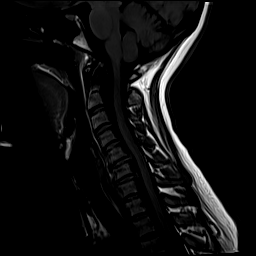
[im 10/15]
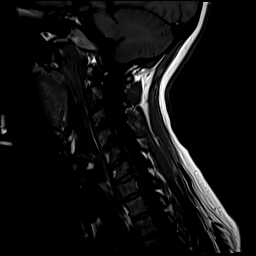
[im 12/15]
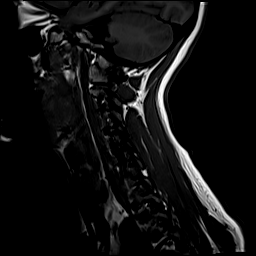
[im 15/15]
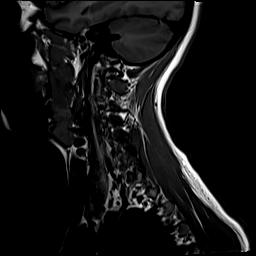

[Series 7: STIR · sagittal · 3.0mm · 0.33mm/px · 7 of 15 slices shown]
[im 1/15]
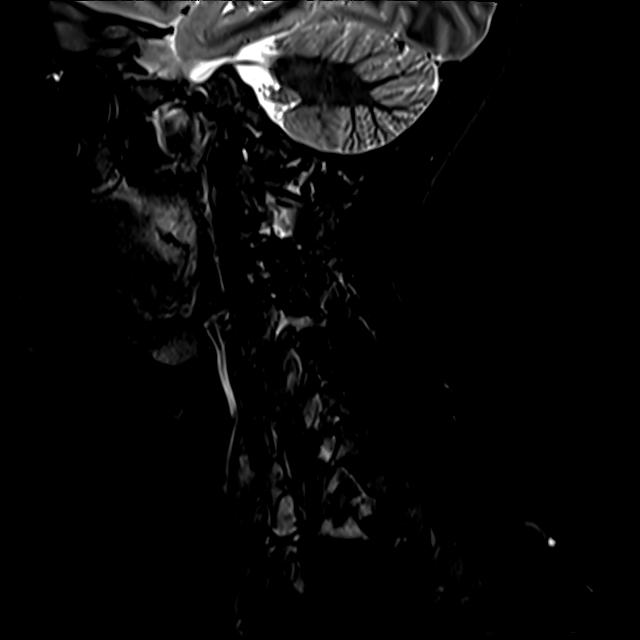
[im 3/15]
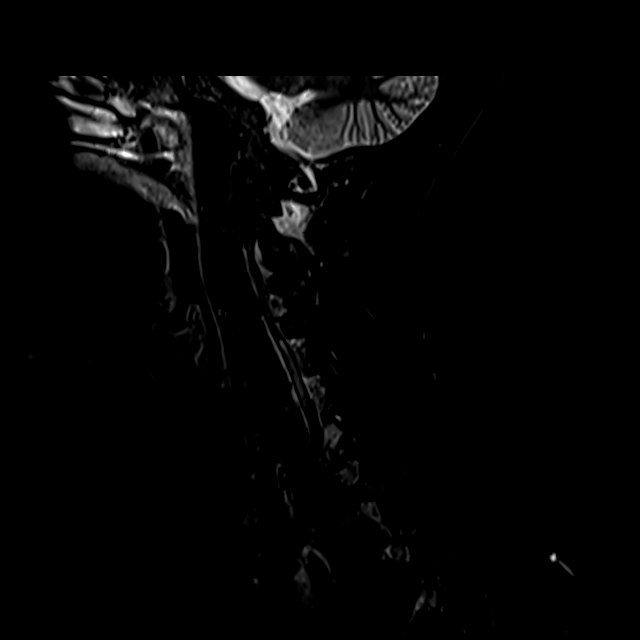
[im 5/15]
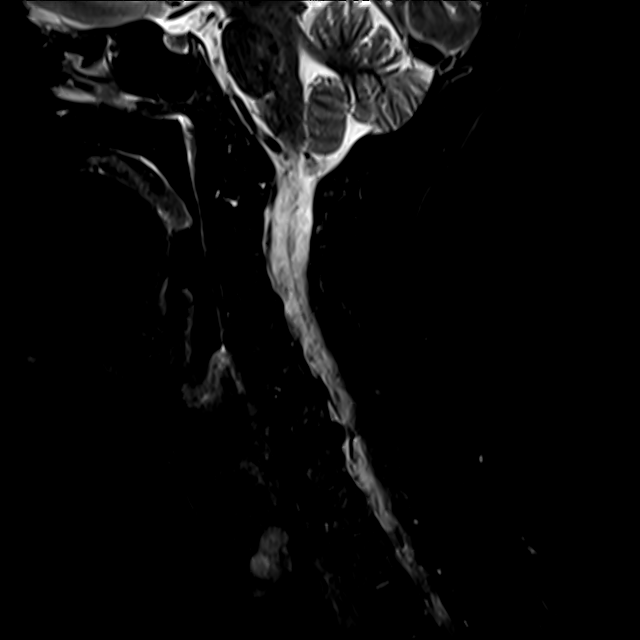
[im 8/15]
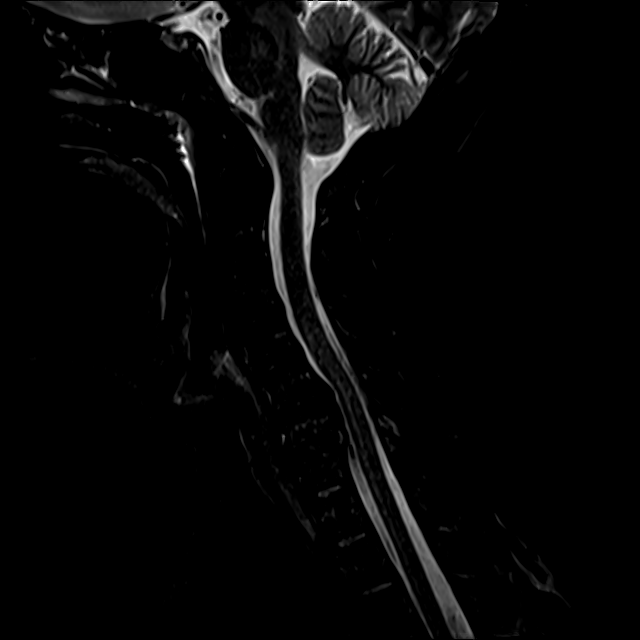
[im 10/15]
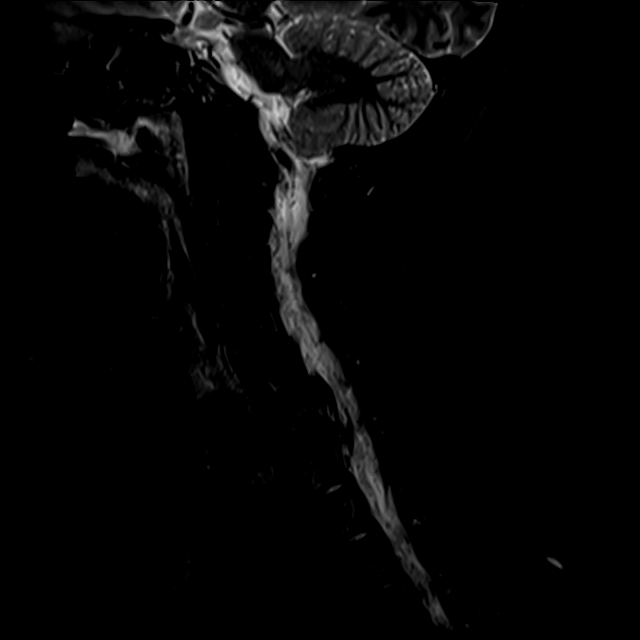
[im 12/15]
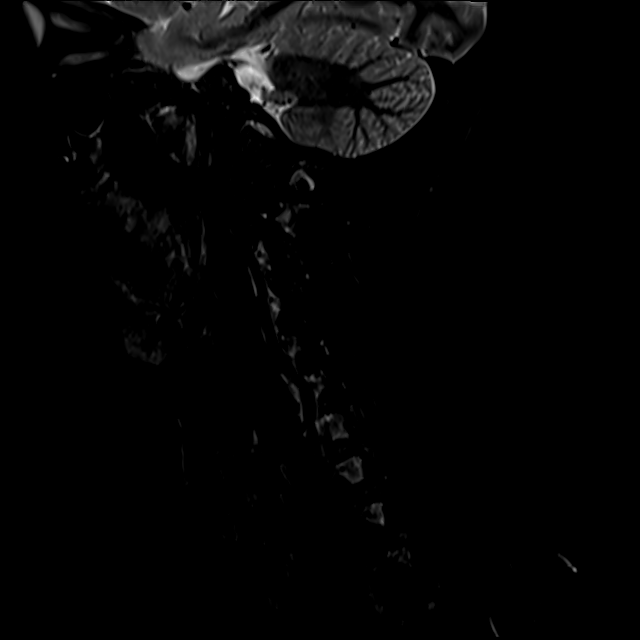
[im 15/15]
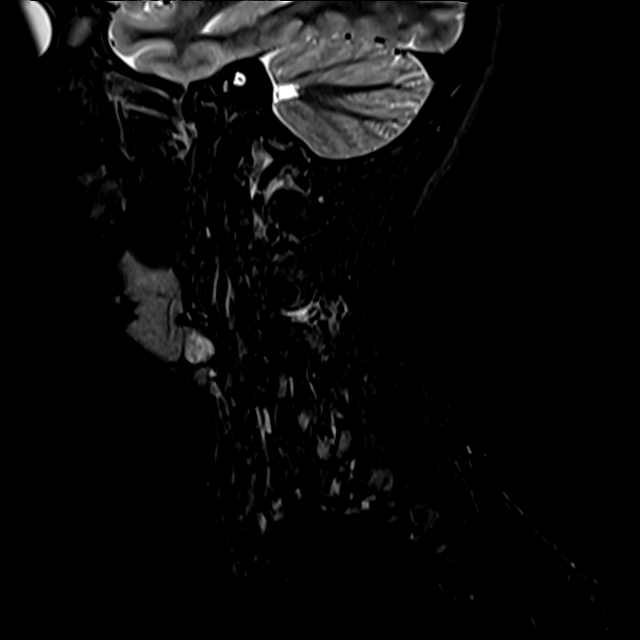

[Series 8: T2 · axial · 3.0mm · 0.50mm/px · z∈[-29,+61]mm · 8 of 30 slices shown (2 of 2)]
[im 1/30]
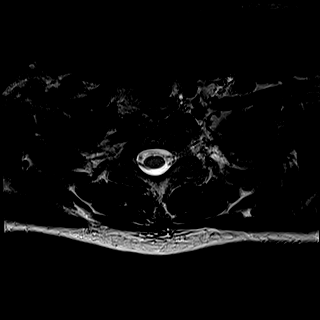
[im 5/30]
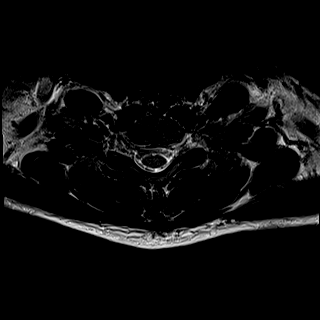
[im 9/30]
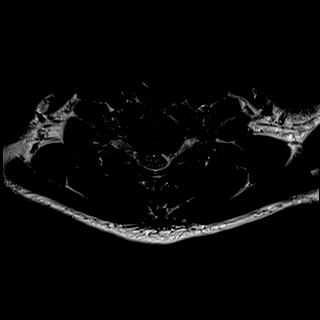
[im 14/30]
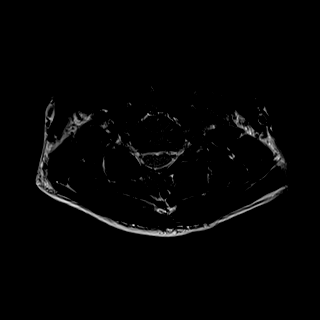
[im 16/30]
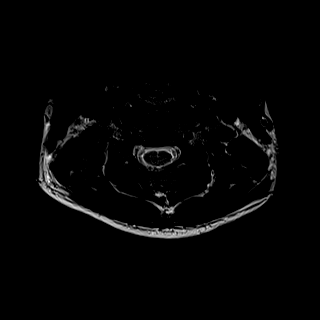
[im 21/30]
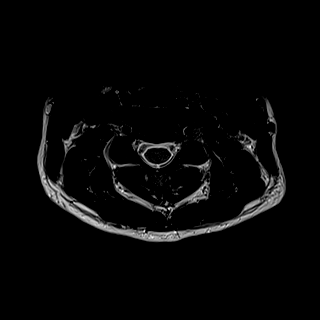
[im 25/30]
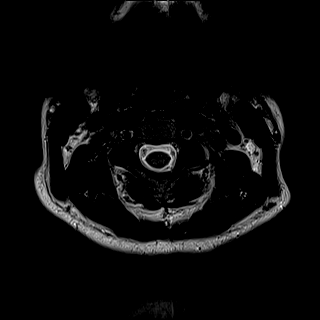
[im 30/30]
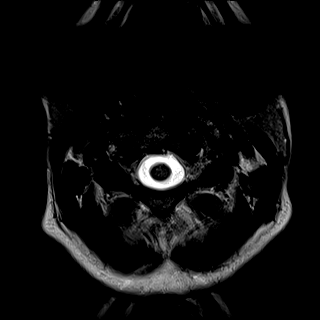

[28 of 48 positions shown; findings below may reference images not displayed]

FINDINGS: Alignment: Slight reversal of lordosis.

Vertebrae: Normal bone marrow signal intensity. No focal osseous
lesion.

Cord: Normal signal and morphology.

Posterior Fossa, vertebral arteries: Negative.

Disc levels: Mild multilevel desiccation.

C2-3: Bilateral facet degenerative spurring. Patent spinal canal and
neural foramen.

C3-4: Small disc osteophyte complex with uncovertebral and facet
degenerative spurring. Patent spinal canal and neural foramen.

C4-5: Small disc osteophyte complex with uncovertebral and facet
degenerative spurring. Patent spinal canal and neural foramen.

C5-6: Disc osteophyte complex with superimposed right paracentral
protrusion abutting the ventral cord. Uncovertebral and facet
degenerative spurring. Mild spinal canal and bilateral neural
foraminal narrowing.

C6-7: Disc osteophyte complex partially effacing the ventral CSF
containing spaces. Uncovertebral degenerative spurring. Mild spinal
canal and bilateral neural foraminal narrowing.

C7-T1: No significant disc bulge. Patent spinal canal and neural
foramen.

Paraspinal tissues: Negative.
IMPRESSION: 1. Cervical spondylosis as outlined.
2. Right C5-6 paracentral protrusion abutting the ventral cord
without myelomalacia.
3. Mild spinal canal and bilateral neural foraminal narrowing at the
C5-6 and C6-7 levels.

## 2023-10-19 DIAGNOSIS — Z809 Family history of malignant neoplasm, unspecified: Secondary | ICD-10-CM | POA: Diagnosis not present

## 2023-10-19 DIAGNOSIS — N182 Chronic kidney disease, stage 2 (mild): Secondary | ICD-10-CM | POA: Diagnosis not present

## 2023-10-19 DIAGNOSIS — M1990 Unspecified osteoarthritis, unspecified site: Secondary | ICD-10-CM | POA: Diagnosis not present

## 2023-10-19 DIAGNOSIS — Z87891 Personal history of nicotine dependence: Secondary | ICD-10-CM | POA: Diagnosis not present

## 2023-10-19 DIAGNOSIS — F319 Bipolar disorder, unspecified: Secondary | ICD-10-CM | POA: Diagnosis not present

## 2023-10-19 DIAGNOSIS — I129 Hypertensive chronic kidney disease with stage 1 through stage 4 chronic kidney disease, or unspecified chronic kidney disease: Secondary | ICD-10-CM | POA: Diagnosis not present

## 2023-11-21 ENCOUNTER — Other Ambulatory Visit: Payer: Self-pay

## 2023-11-21 ENCOUNTER — Encounter (HOSPITAL_BASED_OUTPATIENT_CLINIC_OR_DEPARTMENT_OTHER): Payer: Self-pay | Admitting: Emergency Medicine

## 2023-11-21 ENCOUNTER — Emergency Department (HOSPITAL_BASED_OUTPATIENT_CLINIC_OR_DEPARTMENT_OTHER)
Admission: EM | Admit: 2023-11-21 | Discharge: 2023-11-21 | Disposition: A | Discharge status: Home / Self Care | Payer: 59 | Attending: Emergency Medicine | Admitting: Emergency Medicine

## 2023-11-21 DIAGNOSIS — L02411 Cutaneous abscess of right axilla: Secondary | ICD-10-CM | POA: Diagnosis not present

## 2023-11-21 DIAGNOSIS — E119 Type 2 diabetes mellitus without complications: Secondary | ICD-10-CM | POA: Insufficient documentation

## 2023-11-21 MED ORDER — LIDOCAINE HCL (PF) 1 % IJ SOLN
5.0000 mL | Freq: Once | INTRAMUSCULAR | Status: AC
  Administered 2023-11-21: 5 mL
  Filled 2023-11-21: qty 5

## 2023-11-21 NOTE — ED Triage Notes (Addendum)
 Abscess under R axilla x 1 week. Denies fever or other sx.

## 2023-11-21 NOTE — ED Provider Notes (Addendum)
 Katie Marsh Provider Note   CSN: [id number] Arrival date & time: 11/21/23  0503     History  Chief Complaint  Patient presents with   Abscess    Katie Marsh is a 43 y.o. female with no known history of diabetes, bleeding disorder, or immunocompromised state who presents for an abscess in the right axilla.   Patient states she developed the abscess over the last week, does not recall any trauma to the area such as a cut or bug bite. Denies any other bumps or abscesses elsewhere. Tried applying warm compresses to the area that resulted in a small eschar forming in the center of the abscess due to the water being too hot. Area has not drained pus or blood. Bump was initially itchy, now she is experiencing a constant, stabbing pain. Tylenol  has provided minimal relief, patient would like for it to be incised and drained. She denies any systemic symptoms such as fever, nausea, vomiting, or loose stools.   Per chart review, patient received a Tetanus booster 2019. Only daily medications include fluoxetine  and Abilify .    Abscess      Home Medications Prior to Admission medications   Medication Sig Start Date End Date Taking? Authorizing Provider  acetaminophen  (TYLENOL ) 500 MG tablet Take 500 mg by mouth every 6 (six) hours as needed for mild pain.    [provider]  doxycycline  (VIBRAMYCIN ) 100 MG capsule Take 1 capsule (100 mg total) by mouth 2 (two) times daily. Patient not taking: Reported on 01/16/2019 04/23/17   Khatri, Hina, PA-C  erythromycin  ophthalmic ointment Place 1 application into the right eye 4 (four) times daily. Apply thin 1/2 inch ribbon to lower eyelid every 6 hours x 7 days Patient not taking: Reported on 01/16/2019 04/02/18   Street, [place], PA-C  gabapentin  (NEURONTIN ) 100 MG capsule TAKE 2 CAPSULES BY MOUTH AT BEDTIME 07/21/19   Smith, Zachary M, DO  oxyCODONE -acetaminophen  (PERCOCET/ROXICET) 5-325 MG tablet Take 1  tablet by mouth every 4 (four) hours as needed for severe pain. Patient not taking: Reported on 01/16/2019 04/23/17   Khatri, Hina, PA-C  predniSONE  (DELTASONE ) 50 MG tablet Take 1 tablet (50 mg total) by mouth daily. Patient not taking: Reported on 01/16/2019 12/12/18   Smith, Zachary M, DO      Allergies    Patient has no known allergies.    Review of Systems   Review of Systems  Physical Exam Updated Vital Signs BP 127/70   Pulse 66   Temp 98.5 F (36.9 C) (Oral)   Resp 18   Ht 5' 3 (1.6 m)   Wt 53.1 kg   LMP 11/19/2023   SpO2 100%   BMI 20.73 kg/m  Physical Exam Constitutional:      Appearance: Normal appearance.  HENT:     Head: Normocephalic and atraumatic.     Mouth/Throat:     Mouth: Mucous membranes are moist.  Eyes:     Extraocular Movements: Extraocular movements intact.     Pupils: Pupils are equal, round, and reactive to light.  Pulmonary:     Effort: Pulmonary effort is normal.  Abdominal:     General: Abdomen is flat.     Palpations: Abdomen is soft.  Skin:    Comments: Tender, soft abscess with some fluctuance under right axilla, about 4-5 cm in diameter with central eschar. Tender to touch, feels warm. No signs of drainage or cellulitis in surrounding skin.   Neurological:  Mental Status: She is alert.     ED Results / Procedures / Treatments   Labs (all labs ordered are listed, but only abnormal results are displayed) Labs Reviewed - No data to display  EKG None  Radiology No results found.  Procedures .Incision and Drainage  Date/Time: 11/21/2023 7:45 AM  Performed by: Arellano Zameza, Tomekia Helton, MD Authorized by: Lenor Hollering, MD   Consent:    Consent obtained:  Verbal   Consent given by:  Patient   Risks, benefits, and alternatives were discussed: yes     Risks discussed:  Bleeding, incomplete drainage, pain and infection   Alternatives discussed:  No treatment Universal protocol:    Procedure explained and questions answered to  patient or proxy's satisfaction: yes     Relevant documents present and verified: yes     Test results available : no     Imaging studies available: no     Required blood products, implants, devices, and special equipment available: no     Site/side marked: yes     Patient identity confirmed:  Verbally with patient Location:    Type:  Abscess   Location: Right axilla. Pre-procedure details:    Skin preparation:  Povidone-iodine Sedation:    Sedation type:  None Anesthesia:    Anesthesia method:  Local infiltration   Local anesthetic:  Lidocaine  1% w/o epi Procedure type:    Complexity:  Simple Procedure details:    Incision types:  Elliptical   Incision depth:  Dermal   Drainage:  Purulent and bloody   Drainage amount:  Moderate Post-procedure details:    Procedure completion:  Tolerated     Medications Ordered in ED Medications  lidocaine  (PF) (XYLOCAINE ) 1 % injection 5 mL (5 mLs Infiltration Given 11/21/23 0751)    ED Course/ Medical Decision Making/ A&P                                 Medical Decision Making Risk Prescription drug management.   Patient was informed regarding options for treatment including incision and drainage and no treatment. Patient verbally consented to I & D. Procedure, risks, and benefits were explained and questions were answered. Please see procedure portion above for details.   Patient tolerated procedure well, nursing dressed the area and patient was given instructions on after-care as well as indications to come back for re-evaluation or repeat treatment. Discussed PO antibiotic, came to decision with patient to not treat with antibiotic as abscess was well circumscribed and I & D procedure was successful. Patient is also without any systemic symptoms requiring further treatment. Patient agreeable to plan.   Final Clinical Impression(s) / ED Diagnoses Final diagnoses:  None    Rx / DC Orders ED Discharge Orders     None        Arellano Zameza, Lenna Hagarty, MD Internal Medicine Teaching Service, PGY-1 11/21/23 [id number]    Carlotta Consepcion Felix, MD 11/21/23 1143    Lenor Hollering, MD 11/21/23 1311

## 2023-11-21 NOTE — Discharge Instructions (Addendum)
 You were treated for a skin abscess in your right axilla with an incision and drainage. Please remember to keep your dressing dry and clean, and you may wash out the area with gentle soap and water once a day. Avoid submerging the area under water. You may alternate between over the counter Tylenol  and ibuprofen /Advil  for pain.   Please return to the ED if you notice uncontrollable bleeding, new abscess in the same area, redness and tenderness in the surrounding skin that spreads, or systemic symptoms such as fever, nausea, or vomiting as you may need to be re-evaluated for an infection.

## 2024-07-05 ENCOUNTER — Ambulatory Visit: Payer: Self-pay

## 2024-07-05 NOTE — Telephone Encounter (Signed)
 FYI Only or Action Required?: FYI only for provider.  Patient was last seen in primary care on unknown.  Called Nurse Triage reporting Depression.  Symptoms began several years ago.  Interventions attempted: Prescription medications: Abilify and Other: Staying busy and seeing friends.  Symptoms are: gradually worsening.  Triage Disposition: See Physician Within 24 Hours  Patient/caregiver understands and will follow disposition?: Yes   Copied from CRM #[id number]. Topic: Clinical - Red Word Triage >> Jul 05, 2024  4:11 PM Selinda RAMAN wrote: Red Word that prompted transfer to Nurse Triage: The non established patient called in stating she is manic. She has severe depression and anxiety. She states she is out of her Prozac but still has some Abilify. I will transfer her to E2C2 NT Reason for Disposition  [1] Depression AND [2] getting worse (e.g., sleeping poorly, less able to do activities of daily living)  Answer Assessment - Initial Assessment Questions Pt states symptoms of depression and anxiety have been ongoing for years. She states she tries to get up get dressed and talks to flowers to ease symptoms. Out of Prozac was on 60 mg. She states she has Abilify to take for symptoms. Tries to see friends/ stay busy  and get out more to help symptoms. Without doctor since last year. Unable to focus at work or stay on task. Patient given mental health resources and name of 24/7 behavorial health [facility].    1. CONCERN: What happened that made you call today?     Needed to call to be seen  2. DEPRESSION SYMPTOM SCREENING: How are you feeling overall? (e.g., decreased energy, increased sleeping or difficulty sleeping, difficulty concentrating, feelings of sadness, guilt, hopelessness, or worthlessness)     Increased sleeping during the day.  3. RISK OF HARM - SUICIDAL IDEATION:  Do you ever have thoughts of hurting or killing yourself?  (e.g., yes, no, no but preoccupation with thoughts about  death)     No 4. RISK OF HARM - HOMICIDAL IDEATION:  Do you ever have thoughts of hurting or killing someone else?  (e.g., yes, no, no but preoccupation with thoughts about death)     No 5. FUNCTIONAL IMPAIRMENT: How have things been going for you overall? Have you had more difficulty than usual doing your normal daily activities?  (e.g., better, same, worse; self-care, school, work, interactions)     Yes, difficulty to do normal activities ie. wash dishes or clothes. 6. SUPPORT: Who is with you now? Who do you live with? Do you have family or friends who you can talk to?      Lives alone but has a best friend she can talk to.  7. THERAPIST: Do you have a counselor or therapist? If Yes, ask: What is their name?     No 8. STRESSORS: Has there been any new stress or recent changes in your life?     Yes 9. ALCOHOL USE OR SUBSTANCE USE (DRUG USE): Do you drink alcohol or use any illegal drugs?     No 10. OTHER: Do you have any other physical symptoms right now? (e.g., fever)       Chest tightness , feeling anxious  Protocols used: Depression-A-AH

## 2024-07-06 ENCOUNTER — Ambulatory Visit: Admitting: Nurse Practitioner

## 2024-08-21 ENCOUNTER — Ambulatory Visit (INDEPENDENT_AMBULATORY_CARE_PROVIDER_SITE_OTHER): Admitting: Family Medicine

## 2024-08-21 VITALS — BP 109/73 | HR 57 | Temp 98.4°F | Resp 16 | Ht 63.0 in | Wt 111.8 lb

## 2024-08-21 DIAGNOSIS — F419 Anxiety disorder, unspecified: Secondary | ICD-10-CM | POA: Diagnosis not present

## 2024-08-21 DIAGNOSIS — F32A Depression, unspecified: Secondary | ICD-10-CM | POA: Diagnosis not present

## 2024-08-21 DIAGNOSIS — Z7689 Persons encountering health services in other specified circumstances: Secondary | ICD-10-CM | POA: Diagnosis not present

## 2024-08-21 DIAGNOSIS — B1920 Unspecified viral hepatitis C without hepatic coma: Secondary | ICD-10-CM | POA: Diagnosis not present

## 2024-08-21 DIAGNOSIS — Z8659 Personal history of other mental and behavioral disorders: Secondary | ICD-10-CM

## 2024-08-21 MED ORDER — ARIPIPRAZOLE 15 MG PO TABS: 15.0000 mg | ORAL_TABLET | Freq: Every day | ORAL | 0 refills | Status: DC

## 2024-08-21 MED ORDER — NAPROXEN 500 MG PO TABS: 500.0000 mg | ORAL_TABLET | Freq: Two times a day (BID) | ORAL | 0 refills | Status: DC

## 2024-08-21 MED ORDER — FLUOXETINE HCL 20 MG PO CAPS: 60.0000 mg | ORAL_CAPSULE | Freq: Every day | ORAL | 0 refills | Status: DC

## 2024-08-21 NOTE — Progress Notes (Unsigned)
 New Patient Office Visit  Subjective    Patient ID: Katie Marsh, female    DOB: 1981-04-04  Age: 43 y.o. MRN: [id number]  CC:  Chief Complaint  Patient presents with   Establish Care    HPI Katie Marsh presents to establish care. Patient reports that she had been on meds for MH but has not been on them for the past 3 months. She reports that she had a manic episode about that time and she forged a work note. As a result she was fired from her job and also released from her [facility] provider. She also reports that she was notifed after trying to donate plasma that she had Hepatitis C.    Outpatient Encounter Medications as of 08/21/2024  Medication Sig   FLUoxetine (PROZAC) 20 MG capsule Take 3 capsules (60 mg total) by mouth daily.   naproxen (NAPROSYN) 500 MG tablet Take 1 tablet (500 mg total) by mouth 2 (two) times daily with a meal.   [DISCONTINUED] ARIPiprazole (ABILIFY) 15 MG tablet Take 15 mg by mouth daily.   ARIPiprazole (ABILIFY) 15 MG tablet Take 1 tablet (15 mg total) by mouth daily.   [DISCONTINUED] acetaminophen  (TYLENOL ) 500 MG tablet Take 500 mg by mouth every 6 (six) hours as needed for mild pain.   [DISCONTINUED] doxycycline  (VIBRAMYCIN ) 100 MG capsule Take 1 capsule (100 mg total) by mouth 2 (two) times daily. (Patient not taking: Reported on 01/16/2019)   [DISCONTINUED] erythromycin  ophthalmic ointment Place 1 application into the right eye 4 (four) times daily. Apply thin 1/2 inch ribbon to lower eyelid every 6 hours x 7 days (Patient not taking: Reported on 01/16/2019)   [DISCONTINUED] gabapentin  (NEURONTIN ) 100 MG capsule TAKE 2 CAPSULES BY MOUTH AT BEDTIME   [DISCONTINUED] oxyCODONE -acetaminophen  (PERCOCET/ROXICET) 5-325 MG tablet Take 1 tablet by mouth every 4 (four) hours as needed for severe pain. (Patient not taking: Reported on 01/16/2019)   [DISCONTINUED] predniSONE  (DELTASONE ) 50 MG tablet Take 1 tablet (50 mg total) by mouth daily. (Patient not taking: Reported on  01/16/2019)   No facility-administered encounter medications on file as of 08/21/2024.    Past Medical History:  Diagnosis Date   Abnormal Pap smear    x1, rpt wnl   Anxiety    Asthma    as child   Depression    hx of med usuage   Headache(784.0)    Preterm labor    Urinary tract infection     Past Surgical History:  Procedure Laterality Date   COLPOSCOPY     TUBAL LIGATION  11/16/2011   Procedure: POST PARTUM TUBAL LIGATION;  Surgeon: Donna Just, DO;  Location: [facility];  Service: Gynecology;  Laterality: Bilateral;    Family History  Problem Relation Age of Onset   Anesthesia problems Neg Hx    Hypotension Neg Hx    Malignant hyperthermia Neg Hx    Pseudochol deficiency Neg Hx     Social History   Socioeconomic History   Marital status: Single    Spouse name: Not on file   Number of children: Not on file   Years of education: Not on file   Highest education level: GED or equivalent  Occupational History   Not on file  Tobacco Use   Smoking status: Every Day    Current packs/day: 0.25    Average packs/day: 0.3 packs/day for 15.0 years (3.8 ttl pk-yrs)    Types: Cigarettes   Smokeless tobacco: Never  Vaping Use  Vaping status: Every Day  Substance and Sexual Activity   Alcohol use: No   Drug use: No   Sexual activity: Yes  Other Topics Concern   Not on file  Social History Narrative   Not on file   Social Drivers of Health   Financial Resource Strain: High Risk (08/21/2024)   Overall Financial Resource Strain (CARDIA)    Difficulty of Paying Living Expenses: Very hard  Food Insecurity: Food Insecurity Present (08/21/2024)   Hunger Vital Sign    Worried About Running Out of Food in the Last Year: Often true    Ran Out of Food in the Last Year: Often true  Transportation Needs: No Transportation Needs (08/21/2024)   PRAPARE - Administrator, Civil Service (Medical): No    Lack of Transportation (Non-Medical): No  Physical Activity:  Inactive (08/21/2024)   Exercise Vital Sign    Days of Exercise per Week: 0 days    Minutes of Exercise per Session: Not on file  Stress: Stress Concern Present (08/21/2024)   [employer] of Occupational Health - Occupational Stress Questionnaire    Feeling of Stress: Very much  Social Connections: Socially Isolated (08/21/2024)   Social Connection and Isolation Panel    Frequency of Communication with Friends and Family: More than three times a week    Frequency of Social Gatherings with Friends and Family: Three times a week    Attends Religious Services: Never    Active Member of Clubs or Organizations: No    Attends Engineer, structural: Not on file    Marital Status: Never married  Intimate Partner Violence: Not on file    Review of Systems  All other systems reviewed and are negative.       Objective   BP 109/73   Pulse (!) 57   Temp 98.4 F (36.9 C) (Oral)   Resp 16   Ht 5' 3 (1.6 m)   Wt 111 lb 12.8 oz (50.7 kg)   SpO2 98%   BMI 19.80 kg/m   Physical Exam Vitals and nursing note reviewed.  Constitutional:      General: She is not in acute distress. Cardiovascular:     Rate and Rhythm: Normal rate and regular rhythm.  Pulmonary:     Effort: Pulmonary effort is normal.     Breath sounds: Normal breath sounds.  Abdominal:     Palpations: Abdomen is soft.     Tenderness: There is no abdominal tenderness.  Neurological:     General: No focal deficit present.     Mental Status: She is alert and oriented to person, place, and time.  Psychiatric:        Mood and Affect: Mood is depressed. Affect is tearful.        Speech: Speech normal.        Behavior: Behavior is cooperative.         Assessment & Plan:   Anxiety and depression  Hepatitis C virus infection without hepatic coma, unspecified chronicity -     Ambulatory referral to Psychology -     HCV RNA quant rflx ultra or genotyp  History of mania  Encounter to establish  care  Other orders -     FLUoxetine HCl; Take 3 capsules (60 mg total) by mouth daily.  Dispense: 90 capsule; Refill: 0 -     ARIPiprazole; Take 1 tablet (15 mg total) by mouth daily.  Dispense: 30 tablet; Refill: 0 -  Naproxen; Take 1 tablet (500 mg total) by mouth 2 (two) times daily with a meal.  Dispense: 60 tablet; Refill: 0     Return in about 3 weeks (around 09/11/2024).   Tanda Raguel SQUIBB, MD

## 2024-08-22 ENCOUNTER — Encounter: Payer: Self-pay | Admitting: Family Medicine

## 2024-08-23 LAB — HCV RNA QUANT RFLX ULTRA OR GENOTYP
HCV RNA (IU/mL): 10400000 [IU]/mL
HCV log10: 7.017 {Log_IU}/mL

## 2024-08-23 LAB — HEPATITIS C GENOTYPE: Hepatitis C Genotype: 3

## 2024-08-24 NOTE — Telephone Encounter (Unsigned)
 Copied from CRM [id number]. Topic: Clinical - Lab/Test Results >> Aug 24, 2024  3:48 PM Jasmin G wrote: Reason for CRM: Pt requested a call from one of Dr. Luigi nurses to discuss recent test results. Call pt back at [phone].

## 2024-08-29 ENCOUNTER — Ambulatory Visit: Payer: Self-pay | Admitting: Family Medicine

## 2024-09-13 ENCOUNTER — Other Ambulatory Visit: Payer: Self-pay | Admitting: Family Medicine

## 2024-09-20 ENCOUNTER — Other Ambulatory Visit: Payer: Self-pay

## 2024-09-21 ENCOUNTER — Ambulatory Visit (INDEPENDENT_AMBULATORY_CARE_PROVIDER_SITE_OTHER): Admitting: Family Medicine

## 2024-09-21 VITALS — BP 115/74 | HR 84 | Ht 63.0 in | Wt 124.2 lb

## 2024-09-21 DIAGNOSIS — F319 Bipolar disorder, unspecified: Secondary | ICD-10-CM

## 2024-09-21 DIAGNOSIS — M79601 Pain in right arm: Secondary | ICD-10-CM

## 2024-09-21 DIAGNOSIS — B1920 Unspecified viral hepatitis C without hepatic coma: Secondary | ICD-10-CM | POA: Diagnosis not present

## 2024-09-21 DIAGNOSIS — G8929 Other chronic pain: Secondary | ICD-10-CM | POA: Diagnosis not present

## 2024-09-21 MED ORDER — NAPROXEN 500 MG PO TABS: 500.0000 mg | ORAL_TABLET | Freq: Two times a day (BID) | ORAL | 0 refills | Status: AC

## 2024-09-21 NOTE — Progress Notes (Unsigned)
 Established Patient Office Visit  Subjective    Patient ID: Katie Marsh, female    DOB: 26-Nov-1980  Age: 43 y.o. MRN: [id number]  CC:  Chief Complaint  Patient presents with   Medical Management of Chronic Issues    Pt requests something stronger for the pain in her arm. States naproxen is not helping     HPI Katie Marsh presents for follow up of bipolar disorder. She reports improvement of her symptoms since being back on her meds. She also complains of right upper extremity pain of several months and denies known trauma or injury.   Outpatient Encounter Medications as of 09/21/2024  Medication Sig   ARIPiprazole (ABILIFY) 15 MG tablet Take 1 tablet (15 mg total) by mouth daily.   FLUoxetine (PROZAC) 20 MG capsule Take 3 capsules (60 mg total) by mouth daily.   [DISCONTINUED] naproxen (NAPROSYN) 500 MG tablet Take 1 tablet (500 mg total) by mouth 2 (two) times daily with a meal.   No facility-administered encounter medications on file as of 09/21/2024.    Past Medical History:  Diagnosis Date   Abnormal Pap smear    x1, rpt wnl   Anxiety    Asthma    as child   Depression    hx of med usuage   Headache(784.0)    Preterm labor    Urinary tract infection     Past Surgical History:  Procedure Laterality Date   COLPOSCOPY     TUBAL LIGATION  11/16/2011   Procedure: POST PARTUM TUBAL LIGATION;  Surgeon: Donna Just, DO;  Location: [facility];  Service: Gynecology;  Laterality: Bilateral;    Family History  Problem Relation Age of Onset   Anesthesia problems Neg Hx    Hypotension Neg Hx    Malignant hyperthermia Neg Hx    Pseudochol deficiency Neg Hx     Social History   Socioeconomic History   Marital status: Single    Spouse name: Not on file   Number of children: Not on file   Years of education: Not on file   Highest education level: GED or equivalent  Occupational History   Not on file  Tobacco Use   Smoking status: Every Day    Current packs/day: 0.25     Average packs/day: 0.3 packs/day for 15.0 years (3.8 ttl pk-yrs)    Types: Cigarettes   Smokeless tobacco: Never  Vaping Use   Vaping status: Every Day  Substance and Sexual Activity   Alcohol use: No   Drug use: No   Sexual activity: Yes  Other Topics Concern   Not on file  Social History Narrative   Not on file   Social Drivers of Health   Financial Resource Strain: High Risk (08/21/2024)   Overall Financial Resource Strain (CARDIA)    Difficulty of Paying Living Expenses: Very hard  Food Insecurity: Food Insecurity Present (08/21/2024)   Hunger Vital Sign    Worried About Running Out of Food in the Last Year: Often true    Ran Out of Food in the Last Year: Often true  Transportation Needs: No Transportation Needs (08/21/2024)   PRAPARE - Administrator, Civil Service (Medical): No    Lack of Transportation (Non-Medical): No  Physical Activity: Inactive (08/21/2024)   Exercise Vital Sign    Days of Exercise per Week: 0 days    Minutes of Exercise per Session: Not on file  Stress: Stress Concern Present (08/21/2024)   [employer]  of Occupational Health - Occupational Stress Questionnaire    Feeling of Stress: Very much  Social Connections: Socially Isolated (08/21/2024)   Social Connection and Isolation Panel    Frequency of Communication with Friends and Family: More than three times a week    Frequency of Social Gatherings with Friends and Family: Three times a week    Attends Religious Services: Never    Active Member of Clubs or Organizations: No    Attends Engineer, Structural: Not on file    Marital Status: Never married  Intimate Partner Violence: Not on file    Review of Systems  All other systems reviewed and are negative.       Objective    BP 115/74   Pulse 84   Ht 5' 3 (1.6 m)   Wt 124 lb 3.2 oz (56.3 kg)   LMP 09/21/2024 (Approximate)   SpO2 95%   Breastfeeding No   BMI 22.00 kg/m   Physical Exam Vitals and  nursing note reviewed.  Constitutional:      General: She is not in acute distress. Cardiovascular:     Rate and Rhythm: Normal rate and regular rhythm.  Pulmonary:     Effort: Pulmonary effort is normal.     Breath sounds: Normal breath sounds.  Abdominal:     Palpations: Abdomen is soft.     Tenderness: There is no abdominal tenderness.  Neurological:     General: No focal deficit present.     Mental Status: She is alert and oriented to person, place, and time.  Psychiatric:        Mood and Affect: Mood and affect normal.        Speech: Speech normal.        Behavior: Behavior is cooperative.         Assessment & Plan:   Chronic pain of right upper extremity -     Ambulatory referral to Orthopedic Surgery  Hepatitis C virus infection without hepatic coma, unspecified chronicity -     Ambulatory referral to Gastroenterology  Bipolar I disorder [facility]) -     Ambulatory referral to Psychiatry     No follow-ups on file.   Tanda Raguel SQUIBB, MD

## 2024-09-22 ENCOUNTER — Encounter: Payer: Self-pay | Admitting: Family Medicine

## 2024-09-26 ENCOUNTER — Ambulatory Visit
Admission: EM | Admit: 2024-09-26 | Discharge: 2024-09-26 | Disposition: A | Discharge status: Home / Self Care | Attending: Student | Admitting: Student

## 2024-09-26 ENCOUNTER — Other Ambulatory Visit: Payer: Self-pay

## 2024-09-26 ENCOUNTER — Encounter: Payer: Self-pay | Admitting: Emergency Medicine

## 2024-09-26 DIAGNOSIS — R6889 Other general symptoms and signs: Secondary | ICD-10-CM | POA: Diagnosis not present

## 2024-09-26 LAB — POC COVID19/FLU A&B COMBO
Covid Antigen, POC: NEGATIVE
Influenza A Antigen, POC: NEGATIVE
Influenza B Antigen, POC: NEGATIVE

## 2024-09-26 MED ORDER — ACETAMINOPHEN 500 MG PO TABS
500.0000 mg | ORAL_TABLET | Freq: Four times a day (QID) | ORAL | 0 refills | Status: AC | PRN
Start: 2024-09-26 — End: ?

## 2024-09-26 MED ORDER — ACETAMINOPHEN 325 MG PO TABS
650.0000 mg | ORAL_TABLET | Freq: Once | ORAL | Status: AC
  Administered 2024-09-26: 650 mg via ORAL

## 2024-09-26 NOTE — Discharge Instructions (Addendum)
-  Your covid and influenza tests were negative -You can take Tylenol  500mg  up to every 6 hours for fevers/bodyaches/chills -Naproxen or ibuprofen  for additional relief -Your cough should slowly get better instead of worse. If you develop a cough productive of dark or red sputum, new shortness of breath, new chest tightness, new fevers, etc - seek additional care.

## 2024-09-26 NOTE — ED Triage Notes (Signed)
Pt here for fever, cough and body aches x 3 days

## 2024-09-26 NOTE — ED Provider Notes (Signed)
 [facility]    CSN: [id number] Arrival date & time: 09/26/24  1315      History   Chief Complaint Chief Complaint  Patient presents with   Fever    HPI Katie Marsh is a 43 y.o. female presenting w URI symptoms. H/o childhood asthma, no current inhalers. Pt here for fever, cough and body aches x 3 days. Coughing up white. Chest wall pain with inspiration. Has taken naproxen at home, last dosage 20 hours ago. Denies n/v/d/abd pain.   HPI  Past Medical History:  Diagnosis Date   Abnormal Pap smear    x1, rpt wnl   Anxiety    Asthma    as child   Depression    hx of med usuage   Headache(784.0)    Preterm labor    Urinary tract infection     Patient Active Problem List   Diagnosis Date Noted   Acute shoulder bursitis, right 12/12/2018    Past Surgical History:  Procedure Laterality Date   COLPOSCOPY     TUBAL LIGATION  11/16/2011   Procedure: POST PARTUM TUBAL LIGATION;  Surgeon: Donna Just, DO;  Location: [facility];  Service: Gynecology;  Laterality: Bilateral;    OB History     Gravida  2   Para  2   Term  2   Preterm  0   AB  0   Living  2      SAB  0   IAB  0   Ectopic  0   Multiple  0   Live Births  2            Home Medications    Prior to Admission medications   Medication Sig Start Date End Date Taking? Authorizing Provider  acetaminophen  (TYLENOL ) 500 MG tablet Take 1 tablet (500 mg total) by mouth every 6 (six) hours as needed. 09/26/24  Yes Jerald Hennington E, PA-C  ARIPiprazole (ABILIFY) 15 MG tablet Take 1 tablet (15 mg total) by mouth daily. 09/19/24   Tanda Bleacher, MD  FLUoxetine (PROZAC) 20 MG capsule Take 3 capsules (60 mg total) by mouth daily. 09/19/24   Tanda Bleacher, MD  naproxen (NAPROSYN) 500 MG tablet Take 1 tablet (500 mg total) by mouth 2 (two) times daily with a meal. 09/21/24   Tanda Bleacher, MD    Family History Family History  Problem Relation Age of Onset   Anesthesia problems Neg Hx     Hypotension Neg Hx    Malignant hyperthermia Neg Hx    Pseudochol deficiency Neg Hx     Social History Social History   Tobacco Use   Smoking status: Every Day    Current packs/day: 0.25    Average packs/day: 0.3 packs/day for 15.0 years (3.8 ttl pk-yrs)    Types: Cigarettes   Smokeless tobacco: Never  Vaping Use   Vaping status: Every Day  Substance Use Topics   Alcohol use: No   Drug use: No     Allergies   Patient has no known allergies.   Review of Systems Review of Systems  Constitutional:  Positive for chills and fever. Negative for appetite change.  HENT:  Positive for congestion. Negative for ear pain, rhinorrhea, sinus pressure, sinus pain and sore throat.   Eyes:  Negative for redness and visual disturbance.  Respiratory:  Positive for cough. Negative for chest tightness, shortness of breath and wheezing.   Cardiovascular:  Negative for chest pain and palpitations.  Gastrointestinal:  Negative  for abdominal pain, constipation, diarrhea, nausea and vomiting.  Genitourinary:  Negative for dysuria, frequency and urgency.  Musculoskeletal:  Positive for myalgias.  Neurological:  Negative for dizziness, weakness and headaches.  Psychiatric/Behavioral:  Negative for confusion.   All other systems reviewed and are negative.    Physical Exam Triage Vital Signs ED Triage Vitals  Encounter Vitals Group     BP      Girls Systolic BP Percentile      Girls Diastolic BP Percentile      Boys Systolic BP Percentile      Boys Diastolic BP Percentile      Pulse      Resp      Temp      Temp src      SpO2      Weight      Height      Head Circumference      Peak Flow      Pain Score      Pain Loc      Pain Education      Exclude from Growth Chart    No data found.  Updated Vital Signs BP 103/64 (BP Location: Left Arm)   Pulse (!) 103   Temp (!) 101 F (38.3 C) (Oral)   Resp 18   LMP 09/21/2024 (Approximate)   SpO2 93%   Visual Acuity Right Eye  Distance:   Left Eye Distance:   Bilateral Distance:    Right Eye Near:   Left Eye Near:    Bilateral Near:     Physical Exam Vitals reviewed.  Constitutional:      General: She is not in acute distress.    Appearance: Normal appearance. She is ill-appearing.  HENT:     Head: Normocephalic and atraumatic.     Right Ear: Tympanic membrane, ear canal and external ear normal. No tenderness. No middle ear effusion. There is no impacted cerumen. Tympanic membrane is not perforated, erythematous, retracted or bulging.     Left Ear: Tympanic membrane, ear canal and external ear normal. No tenderness.  No middle ear effusion. There is no impacted cerumen. Tympanic membrane is not perforated, erythematous, retracted or bulging.     Nose: Nose normal. No congestion.     Mouth/Throat:     Mouth: Mucous membranes are moist.     Pharynx: Uvula midline. No oropharyngeal exudate or posterior oropharyngeal erythema.  Eyes:     Extraocular Movements: Extraocular movements intact.     Pupils: Pupils are equal, round, and reactive to light.  Cardiovascular:     Rate and Rhythm: Regular rhythm. Tachycardia present.     Heart sounds: Normal heart sounds.  Pulmonary:     Effort: Pulmonary effort is normal.     Breath sounds: Normal breath sounds. No decreased breath sounds, wheezing, rhonchi or rales.  Abdominal:     Palpations: Abdomen is soft.     Tenderness: There is no abdominal tenderness. There is no guarding or rebound.  Musculoskeletal:     Comments: R anterior chest wall is tender to palpation.  Lymphadenopathy:     Cervical: No cervical adenopathy.     Right cervical: No superficial cervical adenopathy.    Left cervical: No superficial cervical adenopathy.  Neurological:     General: No focal deficit present.     Mental Status: She is alert and oriented to person, place, and time.  Psychiatric:        Mood and Affect: Mood normal.  Behavior: Behavior normal.        Thought  Content: Thought content normal.        Judgment: Judgment normal.      UC Treatments / Results  Labs (all labs ordered are listed, but only abnormal results are displayed) Labs Reviewed  POC COVID19/FLU A&B COMBO - Normal    EKG   Radiology No results found.  Procedures Procedures (including critical care time)  Medications Ordered in UC Medications  acetaminophen  (TYLENOL ) tablet 650 mg (650 mg Oral Given 09/26/24 1425)    Initial Impression / Assessment and Plan / UC Course  I have reviewed the triage vital signs and the nursing notes.  Pertinent labs & imaging results that were available during my care of the patient were reviewed by me and considered in my medical decision making (see chart for details).     Patient is a 43 year old female presenting with febrile, flulike illness.  She is febrile at 101, tachycardic at 103, last antipyretic was 20 hours ago.  She has a distant history of childhood asthma; on exam, there are no adventitious breath sounds, and the chest wall pain is reproducible.  Acetaminophen  administered during visit.  -Covid negative -Influenza negative -Centor score 1, strep test is not indicated  Will manage with acetaminophen  and naproxen or ibuprofen  for fevers and myalgias. Return precautions as below.    Final Clinical Impressions(s) / UC Diagnoses   Final diagnoses:  Flu-like symptoms     Discharge Instructions      -Your covid and influenza tests were negative -You can take Tylenol  500mg  up to every 6 hours for fevers/bodyaches/chills -Naproxen or ibuprofen  for additional relief -Your cough should slowly get better instead of worse. If you develop a cough productive of dark or red sputum, new shortness of breath, new chest tightness, new fevers, etc - seek additional care.      ED Prescriptions     Medication Sig Dispense Auth. Provider   acetaminophen  (TYLENOL ) 500 MG tablet Take 1 tablet (500 mg total) by mouth every 6  (six) hours as needed. 30 tablet Obrien Huskins E, PA-C      PDMP not reviewed this encounter.   Arlyss Leita BRAVO, PA-C 09/26/24 1445

## 2024-09-27 ENCOUNTER — Ambulatory Visit: Admitting: Physician Assistant

## 2024-10-03 ENCOUNTER — Ambulatory Visit: Admitting: Physician Assistant

## 2024-10-03 ENCOUNTER — Other Ambulatory Visit: Payer: Self-pay

## 2024-10-03 ENCOUNTER — Encounter: Payer: Self-pay | Admitting: Physician Assistant

## 2024-10-03 DIAGNOSIS — M542 Cervicalgia: Secondary | ICD-10-CM

## 2024-10-03 MED ORDER — METHOCARBAMOL 500 MG PO TABS: 500.0000 mg | ORAL_TABLET | Freq: Four times a day (QID) | ORAL | 0 refills | Status: AC | PRN

## 2024-10-03 MED ORDER — MELOXICAM 15 MG PO TABS: 15.0000 mg | ORAL_TABLET | Freq: Every day | ORAL | 0 refills | Status: AC

## 2024-10-03 MED ORDER — METHYLPREDNISOLONE 4 MG PO TBPK: ORAL_TABLET | ORAL | 0 refills | Status: AC

## 2024-10-03 NOTE — Progress Notes (Signed)
 Office Visit Note   Patient: Katie Marsh           Date of Birth: 03-21-1981           MRN: [id number] Visit Date: 10/03/2024              Requested by: Tanda Bleacher, MD [address] [place],  [place] [address] PCP: Tanda Bleacher, MD   Assessment & Plan: Visit Diagnoses:  1. Neck pain, bilateral     Plan: Patient is a pleasant 43 year old woman with a history of neck pain radiating down her right arm.  She denies any injuries.  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  Was not offered any treatment.  She comes in today with no acute new injury however continues to have neck pain causes it difficult for her to sleep.  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  I will start her on a Medrol Dosepak.  Instructed her on how to take this and not to take it with Naprosyn.  Once she is finished with them she should go on meloxicam 1 daily and we will also give her Robaxin for her muscle spasm.  There is no red flags today would like her to engage with physical therapy will follow-up with me in a month if she is no better would order a new MRI and refer her to Dr. Eldonna  Follow-Up Instructions: Return in about 1 month (around 11/02/2024).   Orders:  Orders Placed This Encounter  Procedures   XR Cervical Spine 2 or 3 views   Meds ordered this encounter  Medications   methylPREDNISolone (MEDROL DOSEPAK) 4 MG TBPK tablet    Sig: Take as directed with food do not take with other anti-inflammatories    Dispense:  21 tablet    Refill:  0   meloxicam (MOBIC) 15 MG tablet    Sig: Take 1 tablet (15 mg total) by mouth daily.    Dispense:  30 tablet    Refill:  0   methocarbamol (ROBAXIN) 500 MG tablet    Sig: Take 1 tablet (500 mg total) by mouth every 6 (six) hours as needed for muscle spasms.    Dispense:  30 tablet    Refill:  0      Procedures: No procedures  performed   Clinical Data: No additional findings.   Subjective: Chief Complaint  Patient presents with   Neck - Pain    HPI pleasant 43 year old woman is referred for evaluation of neck pain with radiation down her right arm.  Denies any particular injuries this has been going on a few years.  She did have a workup a few years ago but was not offered any treatment p  Review of Systems  All other systems reviewed and are negative.    Objective: Vital Signs: LMP 09/21/2024 (Approximate)   Physical Exam Constitutional:      Appearance: Normal appearance.  Pulmonary:     Effort: Pulmonary effort is normal.  Skin:    General: Skin is warm and dry.  Neurological:     General: No focal deficit present.     Mental Status: She is alert and oriented to person, place, and time.  Psychiatric:        Mood and Affect: Mood normal.        Behavior: Behavior normal.     Ortho Exam Examination she has good  flexion extension of her neck does cause some radicular findings going down her right arm and associated muscle tightness.  She has good strength with resisted abduction external rotation good grip strength.  No muscle atrophy noted.  Sensation is intact. Specialty Comments:  No specialty comments available.  Imaging: XR Cervical Spine 2 or 3 views Result Date: 10/03/2024 Radiographs of the cervical spine demonstrate degenerative changes most noted at C5-6 C6-7.  Loss of normal lordotic curve    PMFS History: Patient Active Problem List   Diagnosis Date Noted   Acute shoulder bursitis, right 12/12/2018   Past Medical History:  Diagnosis Date   Abnormal Pap smear    x1, rpt wnl   Anxiety    Asthma    as child   Depression    hx of med usuage   Headache(784.0)    Preterm labor    Urinary tract infection     Family History  Problem Relation Age of Onset   Anesthesia problems Neg Hx    Hypotension Neg Hx    Malignant hyperthermia Neg Hx    Pseudochol deficiency  Neg Hx     Past Surgical History:  Procedure Laterality Date   COLPOSCOPY     TUBAL LIGATION  11/16/2011   Procedure: POST PARTUM TUBAL LIGATION;  Surgeon: Donna Just, DO;  Location: [facility];  Service: Gynecology;  Laterality: Bilateral;   Social History   Occupational History   Not on file  Tobacco Use   Smoking status: Every Day    Current packs/day: 0.25    Average packs/day: 0.3 packs/day for 15.0 years (3.8 ttl pk-yrs)    Types: Cigarettes   Smokeless tobacco: Never  Vaping Use   Vaping status: Every Day  Substance and Sexual Activity   Alcohol use: No   Drug use: No   Sexual activity: Yes

## 2024-10-24 ENCOUNTER — Ambulatory Visit: Admitting: Physical Therapy

## 2024-10-24 NOTE — Therapy (Incomplete)
 OUTPATIENT PHYSICAL THERAPY SHOULDER EVALUATION   Patient Name: Katie Marsh MRN: [id number] DOB:1981-01-15, 43 y.o., female Today's Date: 10/24/2024    Past Medical History:  Diagnosis Date   Abnormal Pap smear    x1, rpt wnl   Anxiety    Asthma    as child   Depression    hx of med usuage   Headache(784.0)    Preterm labor    Urinary tract infection    Past Surgical History:  Procedure Laterality Date   COLPOSCOPY     TUBAL LIGATION  11/16/2011   Procedure: POST PARTUM TUBAL LIGATION;  Surgeon: Katie Just, DO;  Location: [facility];  Service: Gynecology;  Laterality: Bilateral;   Patient Active Problem List   Diagnosis Date Noted   Acute shoulder bursitis, right 12/12/2018    PCP: Katie Bleacher, MD  REFERRING PROVIDER: Persons, Katie Marsh, Katie  THERAPY DIAG:  No diagnosis found.  REFERRING DIAG: Neck pain, bilateral [M54.2]   Rationale for Evaluation and Treatment:  Rehabilitation  SUBJECTIVE:  PERTINENT PAST HISTORY:  R shoulder pain        PRECAUTIONS: {Therapy precautions:24002}  WEIGHT BEARING RESTRICTIONS {Yes ***/No:24003}  FALLS:  Has patient fallen in last 6 months? {yes/no:20286}, Number of falls: ***  MOI/History of condition:  Onset date: ***  SUBJECTIVE STATEMENT  Pt is a 43 y.o. female who presents to clinic with chief complaint of ***.  ***   Red flags:  {has/denies:26543} {kerredflag:26542}  Pain:  Are you having pain? {yes/no:20286} Pain location: *** NPRS scale:  Best: {NUMBERS; 0-10:5044}/10, Worst: {NUMBERS; 0-10:5044}/10 Aggravating factors: *** Relieving factors: *** Pain description: {PAIN DESCRIPTION:21022940}  Occupation: ***  Assistive Device: ***  Hand Dominance: ***  Patient Goals/Specific Activities: ***   OBJECTIVE:   DIAGNOSTIC FINDINGS:  Radiographs of the cervical spine demonstrate degenerative changes most  noted at C5-6 C6-7.  Loss of normal lordotic curve   GENERAL  OBSERVATION: ***     SENSATION: Light touch: {intact/deficits:24005}   PALPATION: ***  Cervical ROM  ROM ROM  (Eval)  Flexion ***  Extension ***  Right lateral flexion ***  Left lateral flexion ***  Right rotation ***  Left rotation ***  Flexion rotation (normal is 30 degrees)   Flexion rotation (normal is 30 degrees)     (Blank rows = not tested, N = WNL, * = concordant pain)  UPPER EXTREMITY MMT:  MMT Right (Eval) Left (Eval)  Shoulder flexion    Shoulder abduction (C5)    Shoulder ER    Shoulder IR    Middle trapezius    Lower trapezius    Shoulder extension    Grip strength    Shoulder shrug (C4)    Elbow flexion (C6)    Elbow ext (C7)    Thumb ext (C8)    Finger abd (T1)    Grossly     (Blank rows = not tested, score listed is out of 5 possible points.  N = WNL, D = diminished, C = clear for gross weakness with myotome testing, * = concordant pain with testing)   SPECIAL TESTS:  Spurling's: ***  JOINT MOBILITY TESTING:  ***  PATIENT SURVEYS:  NDI: ***    HOME EXERCISE PROGRAM: ***  Treatment priorities   Eval  TODAY'S TREATMENT:  Therapeutic Exercise: Creating, reviewing, and completing HEP   PATIENT EDUCATION ([place]/HM):  POC, diagnosis, prognosis, HEP, and outcome measures.  Pt educated via explanation, demonstration, and handout (HEP).  Pt confirms understanding verbally.   ASSESSMENT:  CLINICAL IMPRESSION: Katie Marsh is a 43 y.o. female who presents to clinic with signs and sxs consistent with ***.   ***.   Katie Marsh will benefit from skilled PT to address relevant deficits and improve ***.   OBJECTIVE IMPAIRMENTS: Pain, ***  ACTIVITY LIMITATIONS: ***  PERSONAL FACTORS: See medical history and pertinent history   REHAB POTENTIAL: Good  CLINICAL DECISION MAKING: Evolving/moderate complexity  EVALUATION COMPLEXITY: Moderate   GOALS:   SHORT TERM GOALS: Target date: ***  Katie Marsh  will be >75% HEP compliant to improve carryover between sessions and facilitate independent management of condition  Evaluation: ongoing Goal status: INITIAL   LONG TERM GOALS: Target date: ***  Katie Marsh will self report >/= 50% decrease in pain from evaluation to improve function in daily tasks  Evaluation/Baseline: ***/10 max pain Goal status: INITIAL   2.  Katie Marsh will show a >/= *** pt improvement in their NDI score (MCID ~20% or 10/50 pts) as a proxy for functional improvement  Evaluation/Baseline: *** pts Goal status: INITIAL   3.  Katie Marsh will be able to ***, not limited by pain  Evaluation/Baseline: limited Goal status: INITIAL   4.  ***   5.  ***   6.  ***   PLAN: PT FREQUENCY: 1-2x/week  PT DURATION: 8 weeks  PLANNED INTERVENTIONS:  97164- PT Re-evaluation, 97110-Therapeutic exercises, 97530- Therapeutic activity, [id number]- Neuromuscular re-education, 97535- Self Care, 02859- Manual therapy, [id number]- Gait training, [id number]- Aquatic Therapy, [phone]- Electrical stimulation (manual), [id number]- Vasopneumatic device, [id number]- Traction (mechanical), [id number]- Ionotophoresis 4mg /ml Dexamethasone , Taping, Dry Needling, Joint manipulation, and Spinal manipulation.   Katie Marsh PT 10/24/2024, 12:54 PM
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
## 2024-11-02 ENCOUNTER — Ambulatory Visit: Attending: Physician Assistant

## 2024-11-02 ENCOUNTER — Other Ambulatory Visit: Payer: Self-pay

## 2024-11-02 DIAGNOSIS — M6281 Muscle weakness (generalized): Secondary | ICD-10-CM | POA: Diagnosis present

## 2024-11-02 DIAGNOSIS — R208 Other disturbances of skin sensation: Secondary | ICD-10-CM | POA: Diagnosis present

## 2024-11-02 DIAGNOSIS — M542 Cervicalgia: Secondary | ICD-10-CM | POA: Diagnosis present

## 2024-11-03 ENCOUNTER — Ambulatory Visit: Admitting: Physician Assistant

## 2024-11-06 ENCOUNTER — Other Ambulatory Visit: Payer: Self-pay | Admitting: Family Medicine

## 2024-11-06 ENCOUNTER — Ambulatory Visit: Admitting: Physical Therapy

## 2024-11-06 DIAGNOSIS — Z1231 Encounter for screening mammogram for malignant neoplasm of breast: Secondary | ICD-10-CM

## 2024-11-13 ENCOUNTER — Other Ambulatory Visit: Payer: Self-pay | Admitting: Family Medicine

## 2024-11-13 NOTE — Telephone Encounter (Unsigned)
 Copied from CRM #[id number]. Topic: Clinical - Medication Refill >> Nov 13, 2024 12:40 PM Mesmerise C wrote: Medication: ARIPiprazole  (ABILIFY ) 15 MG tablet FLUoxetine  (PROZAC ) 20 MG capsule naproxen  (NAPROSYN ) 500 MG tablet Has the patient contacted their pharmacy? Yes (Agent: If no, request that the patient contact the pharmacy for the refill. If patient does not wish to contact the pharmacy document the reason why and proceed with request.) (Agent: If yes, when and what did the pharmacy advise?)  This is the patient's preferred pharmacy:  [facility] - [place], [place] - [address] [place] [address] Phone: [phone] Fax: [phone]  Is this the correct pharmacy for this prescription? Yes If no, delete pharmacy and type the correct one.   Has the prescription been filled recently? No  Is the patient out of the medication? No  Has the patient been seen for an appointment in the last year OR does the patient have an upcoming appointment? Yes  Can we respond through MyChart? Yes  Agent: Please be advised that Rx refills may take up to 3 business days. We ask that you follow-up with your pharmacy.

## 2024-11-14 ENCOUNTER — Ambulatory Visit: Admitting: Physician Assistant

## 2024-11-14 NOTE — Telephone Encounter (Signed)
 Duplicate request.  Requested Prescriptions  Pending Prescriptions Disp Refills   ARIPiprazole  (ABILIFY ) 15 MG tablet 30 tablet 0    Sig: Take 1 tablet (15 mg total) by mouth daily.     Not Delegated - Psychiatry:  Antipsychotics - Second Generation (Atypical) - aripiprazole  Failed - 11/14/2024  4:20 PM      Failed - This refill cannot be delegated      Failed - TSH in normal range and within 360 days    No results found for: TSH, POCTSH, TSHREFLEX       Failed - Lipid Panel in normal range within the last 12 months    No results found for: CHOL, POCCHOL, CHOLTOT No results found for: LDLCALC, LDLC, HIRISKLDL, POCLDL, LDLDIRECT, REALLDLC, TOTLDLC No results found for: HDL, POCHDL No results found for: TRIG, POCTRIG       Failed - CBC within normal limits and completed in the last 12 months    WBC  Date Value Ref Range Status  11/14/2011 23.6 (H) 4.0 - 10.5 K/uL Final   RBC  Date Value Ref Range Status  11/14/2011 3.95 3.87 - 5.11 MIL/uL Final   Hemoglobin  Date Value Ref Range Status  11/14/2011 11.7 (L) 12.0 - 15.0 g/dL Final   HCT  Date Value Ref Range Status  11/14/2011 35.0 (L) 36.0 - 46.0 % Final   MCHC  Date Value Ref Range Status  11/14/2011 33.4 30.0 - 36.0 g/dL Final   MCH  Date Value Ref Range Status  11/14/2011 29.6 26.0 - 34.0 pg Final   MCV  Date Value Ref Range Status  11/14/2011 88.6 78.0 - 100.0 fL Final   No results found for: PLTCOUNTKUC, LABPLAT, POCPLA RDW  Date Value Ref Range Status  11/14/2011 13.3 11.5 - 15.5 % Final         Failed - CMP within normal limits and completed in the last 12 months    No results found for: ALBUMIN, ALBMS, POCALB, ALBUMINELP No results found for: POCALP, ALKPHOS, ALKPHOSAPISO, ALKPHOSISO No results found for: ALT, LABALT, POCALT No results found for: POCAST, AST BUN  Date Value Ref Range Status  03/25/2010 9 6 - 23 mg/dL Final    Calcium, Ion  Date Value Ref Range Status  03/25/2010 1.17 1.12 - 1.32 mmol/L Final   TCO2  Date Value Ref Range Status  03/25/2010 27 0 - 100 mmol/L Final   Creatinine, Ser  Date Value Ref Range Status  03/25/2010 0.8 0.4 - 1.2 mg/dL Final   Glucose, Bld  Date Value Ref Range Status  03/25/2010 85 70 - 99 mg/dL Final   Potassium  Date Value Ref Range Status  03/25/2010 4.1 3.5 - 5.1 mEq/L Final   Sodium  Date Value Ref Range Status  03/25/2010 141 135 - 145 mEq/L Final   No results found for: BILITOT, LABBILI, BILIDIRECT, BILIDIR, IBILI Protein, ur  Date Value Ref Range Status  08/20/2011 NEGATIVE NEGATIVE mg/dL Final   No results found for: GFRAA, GFR, EGFR, GFRNONAA       Passed - Completed PHQ-2 or PHQ-9 in the last 360 days      Passed - Last BP in normal range    BP Readings from Last 1 Encounters:  09/26/24 103/64         Passed - Last Heart Rate in normal range    Pulse Readings from Last 1 Encounters:  09/26/24 (!) 103         Passed - Valid encounter  within last 6 months    Recent Outpatient Visits           1 month ago Chronic pain of right upper extremity   [facility], [place], MD   2 months ago Anxiety and depression   [facility], [place], MD               FLUoxetine  (PROZAC ) 20 MG capsule 90 capsule 0    Sig: Take 3 capsules (60 mg total) by mouth daily.     Psychiatry:  Antidepressants - SSRI Passed - 11/14/2024  4:20 PM      Passed - Valid encounter within last 6 months    Recent Outpatient Visits           1 month ago Chronic pain of right upper extremity   [facility], MD   2 months ago Anxiety and depression   [facility], [place], MD               naproxen  (NAPROSYN ) 500 MG tablet 60 tablet 0    Sig: Take 1 tablet (500 mg total) by mouth 2  (two) times daily with a meal.     Analgesics:  NSAIDS Failed - 11/14/2024  4:20 PM      Failed - Manual Review: Labs are only required if the patient has taken medication for more than 8 weeks.      Failed - Cr in normal range and within 360 days    Creatinine, Ser  Date Value Ref Range Status  03/25/2010 0.8 0.4 - 1.2 mg/dL Final         Failed - HGB in normal range and within 360 days    Hemoglobin  Date Value Ref Range Status  11/14/2011 11.7 (L) 12.0 - 15.0 g/dL Final         Failed - PLT in normal range and within 360 days    Platelets  Date Value Ref Range Status  11/14/2011 112 (L) 150 - 400 K/uL Final    Comment:    SPECIMEN CHECKED FOR CLOTS REPEATED TO VERIFY PLATELET COUNT CONFIRMED BY SMEAR LARGE PLATELETS PRESENT         Failed - HCT in normal range and within 360 days    HCT  Date Value Ref Range Status  11/14/2011 35.0 (L) 36.0 - 46.0 % Final         Failed - eGFR is 30 or above and within 360 days    No results found for: GFRAA, GFRNONAA, GFR, EGFR       Passed - Patient is not pregnant      Passed - Valid encounter within last 12 months    Recent Outpatient Visits           1 month ago Chronic pain of right upper extremity   [facility], MD   2 months ago Anxiety and depression    [facility], MD

## 2024-11-15 ENCOUNTER — Other Ambulatory Visit: Payer: Self-pay
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
## 2024-11-21 ENCOUNTER — Ambulatory Visit
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
## 2024-11-23 ENCOUNTER — Ambulatory Visit: Attending: Physician Assistant

## 2024-11-23 DIAGNOSIS — R208 Other disturbances of skin sensation: Secondary | ICD-10-CM | POA: Insufficient documentation

## 2024-11-23 DIAGNOSIS — M542 Cervicalgia: Secondary | ICD-10-CM | POA: Insufficient documentation

## 2024-11-23 DIAGNOSIS — M6281 Muscle weakness (generalized): Secondary | ICD-10-CM | POA: Diagnosis present

## 2024-11-24 ENCOUNTER — Ambulatory Visit: Admitting: Physician Assistant

## 2024-11-24 ENCOUNTER — Encounter: Payer: Self-pay | Admitting: Physician Assistant

## 2024-11-24 DIAGNOSIS — M542 Cervicalgia: Secondary | ICD-10-CM | POA: Diagnosis not present

## 2024-11-24 DIAGNOSIS — G5601 Carpal tunnel syndrome, right upper limb: Secondary | ICD-10-CM | POA: Diagnosis not present

## 2024-11-24 NOTE — Progress Notes (Addendum)
 "  Office Visit Note   Patient: Katie Marsh           Date of Birth: 28-May-1981           MRN: [id number] Visit Date: 11/24/2024              Requested by: Tanda Bleacher, MD [address] [place],  [place] [address] PCP: Tanda Bleacher, MD  Chief Complaint  Patient presents with   Neck - Pain      HPI: Katie Marsh is a pleasant 44 year old woman who I have seen for the last few months for neck pain.  It radiates down her right side.  She has had a history of neck pain for several years but got worse in the last few months.  She has tried a Medrol  Dosepak.  Most recently she has done physical therapy for her neck.  Unfortunately the physical therapy has only made things worse.  She has pain that goes in her neck all the way down to her arm and her hand.  She has failed conservative treatment including physical therapy steroids and anti-inflammatories. Secondarily she has a long history of carpal tunnel syndrome that was diagnosed in her 96s.  She did have a nerve test at that time.  She was not given any follow-up options.  This is also bothering her in her right wrist and her hand is numb most the time  Assessment & Plan: Visit Diagnoses:  1. Carpal tunnel syndrome, right upper limb   2. Neck pain, bilateral   3. Neck pain     Plan: With regards to her neck she has had increasing neck pain despite a Medrol  Dosepak despite anti-inflammatories and is actually gotten worse and more progressive with physical therapy.  She does have radiculopathy that goes down her right arm.  This has been going on for 3 months.  I am recommending an MRI of her cervical spine with referral to Dr. Eldonna if appropriate for injections.  With regards to her carpal tunnel she has a positive Tinel's sign she has to wear a brace all the time to keep her hand comfortable.  I have recommended nerve studies of the right hand if it demonstrates carpal tunnel would refer her to discuss carpal tunnel release.     Follow-Up Instructions: Will call with MRI and nerve study results  Ortho Exam  Patient is alert, oriented, no adenopathy, well-dressed, normal affect, normal respiratory effort. Examination of the patient's neck she has severe pain with flexion extension side-to-side turning.  She has 3 out of 5 grip strength in the right hand.  She has significant decreased sensation in the right hand also positive Tinel sign.  She has pain that radiates from the neck down the arm into the fingers.  She does have some popping in her shoulder which I believe is a separate issue    Imaging: No results found. No images are attached to the encounter.  Labs: No results found for: HGBA1C, ESRSEDRATE, CRP, LABURIC, REPTSTATUS, GRAMSTAIN, CULT, LABORGA   No results found for: ALBUMIN, PREALBUMIN, CBC  No results found for: MG No results found for: VD25OH  No results found for: PREALBUMIN    Latest Ref Rng & Units 11/14/2011    7:30 AM 11/14/2011    1:20 AM 03/25/2010   10:48 AM  CBC EXTENDED  WBC 4.0 - 10.5 K/uL 23.6  18.3    RBC 3.87 - 5.11 MIL/uL 3.95  4.41    Hemoglobin  12.0 - 15.0 g/dL 88.2  86.6  84.6   HCT 36.0 - 46.0 % 35.0  39.3  45.0   Platelets 150 - 400 K/uL 112  132       There is no height or weight on file to calculate BMI.  Orders:  Orders Placed This Encounter  Procedures   MR Cervical Spine w/o contrast   Ambulatory referral to Physical Medicine Rehab   No orders of the defined types were placed in this encounter.    Procedures: No procedures performed  Clinical Data: No additional findings.  ROS:  All other systems negative, except as noted in the HPI. Review of Systems  Objective: Vital Signs: There were no vitals taken for this visit.  Specialty Comments:  No specialty comments available.  PMFS History: Patient Active Problem List   Diagnosis Date Noted   Carpal tunnel syndrome, right upper limb 11/24/2024   Neck pain  11/24/2024   Acute shoulder bursitis, right 12/12/2018   Past Medical History:  Diagnosis Date   Abnormal Pap smear    x1, rpt wnl   Anxiety    Asthma    as child   Depression    hx of med usuage   Headache(784.0)    Preterm labor    Urinary tract infection     Family History  Problem Relation Age of Onset   Anesthesia problems Neg Hx    Hypotension Neg Hx    Malignant hyperthermia Neg Hx    Pseudochol deficiency Neg Hx     Past Surgical History:  Procedure Laterality Date   COLPOSCOPY     TUBAL LIGATION  11/16/2011   Procedure: POST PARTUM TUBAL LIGATION;  Surgeon: Donna Just, DO;  Location: [facility];  Service: Gynecology;  Laterality: Bilateral;   Social History   Occupational History   Not on file  Tobacco Use   Smoking status: Every Day    Current packs/day: 0.25    Average packs/day: 0.3 packs/day for 15.0 years (3.8 ttl pk-yrs)    Types: Cigarettes   Smokeless tobacco: Never  Vaping Use   Vaping status: Every Day  Substance and Sexual Activity   Alcohol use: No   Drug use: No   Sexual activity: Yes       "

## 2024-11-28 ENCOUNTER — Ambulatory Visit

## 2024-11-28 ENCOUNTER — Encounter

## 2024-11-28 DIAGNOSIS — Z1231 Encounter for screening mammogram for malignant neoplasm of breast: Secondary | ICD-10-CM

## 2024-11-30 ENCOUNTER — Ambulatory Visit

## 2024-11-30 ENCOUNTER — Other Ambulatory Visit

## 2024-11-30 ENCOUNTER — Ambulatory Visit: Payer: Self-pay | Admitting: Family Medicine

## 2024-12-01 ENCOUNTER — Telehealth: Payer: Self-pay | Admitting: Family Medicine

## 2024-12-01 NOTE — Telephone Encounter (Signed)
 Copied from CRM #[id number]. Topic: Referral - Status >> Dec 01, 2024  1:11 PM Travis F wrote: Reason for CRM: Patient is calling in because she is requesting that Dr. Tanda sent her referral to the wrong place. Patient says they told her they don't handle Hep C. She is requesting it be sent to [facility].

## 2024-12-05 ENCOUNTER — Other Ambulatory Visit: Payer: Self-pay | Admitting: Family Medicine

## 2024-12-05 DIAGNOSIS — B1920 Unspecified viral hepatitis C without hepatic coma: Secondary | ICD-10-CM

## 2024-12-06 ENCOUNTER — Other Ambulatory Visit: Payer: Self-pay | Admitting: Family

## 2024-12-08 ENCOUNTER — Ambulatory Visit
Admission: RE | Admit: 2024-12-08 | Discharge: 2024-12-08 | Disposition: A | Source: Ambulatory Visit | Attending: Physician Assistant

## 2024-12-08 DIAGNOSIS — M542 Cervicalgia: Secondary | ICD-10-CM

## 2024-12-13 NOTE — Progress Notes (Unsigned)
 " Psychiatric Initial Adult Assessment   Patient Identification: Katie Marsh MRN:  [id number] Date of Evaluation:  12/13/2024 Referral Source: *** Chief Complaint:  No chief complaint on file.  Visit Diagnosis: No diagnosis found.   Assessment:  Katie Marsh is a 44 y.o. female with a history of *** who presents in person to [facility] for initial evaluation on 12/13/2024.    At initial evaluation patient reports ***  A number of assessments were performed during the evaluation today including  PHQ-9 which they scored a *** on, GAD-7 which they scored a *** on, and [place] suicide severity screening which showed ***.    Risk Assessment: A suicide and violence risk assessment was performed as part of this evaluation. There patient is deemed to be at chronic elevated risk for self-harm/suicide given the following factors: {SABSUICIDERISKFACTORS:29780}. These risk factors are mitigated by the following factors: {SABSUICIDEPROTECTIVEFACTORS:29779}. The patient is deemed to be at chronic elevated risk for violence given the following factors: {SABVIOLENCERISKFACTORS:29781}. These risk factors are mitigated by the following factors: {SABVIOLENCEPROTECTIVEFACTORS:29782}. There is no *** acute risk for suicide or violence at this time. The patient was educated about relevant modifiable risk factors including following recommendations for treatment of psychiatric illness and abstaining from substance abuse.  While future psychiatric events cannot be accurately predicted, the patient does not *** currently require  acute inpatient psychiatric care and does not *** currently meet [place]  involuntary commitment criteria.  Patient was given contact information for crisis resources, behavioral health clinic and was instructed to call 911 for emergencies.    Plan: # *** Past medication trials:  Status of problem: *** Interventions: -- ***  # *** Past medication  trials:  Status of problem: *** Interventions: -- ***  # *** Past medication trials:  Status of problem: *** Interventions: -- ***   History of Present Illness:  ***  Associated Signs/Symptoms: Depression Symptoms:  {DEPRESSION SYMPTOMS:20000} (Hypo) Manic Symptoms:  {BHH MANIC SYMPTOMS:22872} Anxiety Symptoms:  {BHH ANXIETY SYMPTOMS:22873} Psychotic Symptoms:  {BHH PSYCHOTIC SYMPTOMS:22874} PTSD Symptoms: {BHH PTSD SYMPTOMS:22875}  Past Psychiatric History:  Past psychiatric diagnoses: *** Psychiatric hospitalizations:*** Past suicide attempts: *** Hx of self harm: *** Hx of violence towards others: *** Prior psychiatric providers: *** Prior therapy: *** Access to firearms: ***  Prior medication trials: ***  Substance use: ***  Past Medical History:  Past Medical History:  Diagnosis Date   Abnormal Pap smear    x1, rpt wnl   Anxiety    Asthma    as child   Depression    hx of med usuage   Headache(784.0)    Preterm labor    Urinary tract infection     Past Surgical History:  Procedure Laterality Date   COLPOSCOPY     TUBAL LIGATION  11/16/2011   Procedure: POST PARTUM TUBAL LIGATION;  Surgeon: Donna Just, DO;  Location: [facility];  Service: Gynecology;  Laterality: Bilateral;    Family Psychiatric History: ***  Family History:  Family History  Problem Relation Age of Onset   Breast cancer Maternal Great-grandmother    Anesthesia problems Neg Hx    Hypotension Neg Hx    Malignant hyperthermia Neg Hx    Pseudochol deficiency Neg Hx     Social History:   Social History   Socioeconomic History   Marital status: Single    Spouse name: Not on file   Number of children: Not on file   Years of education: Not  on file   Highest education level: GED or equivalent  Occupational History   Not on file  Tobacco Use   Smoking status: Every Day    Current packs/day: 0.25    Average packs/day: 0.3 packs/day for 15.0 years (3.8  ttl pk-yrs)    Types: Cigarettes   Smokeless tobacco: Never  Vaping Use   Vaping status: Every Day  Substance and Sexual Activity   Alcohol use: No   Drug use: No   Sexual activity: Yes  Other Topics Concern   Not on file  Social History Narrative   Not on file   Social Drivers of Health   Tobacco Use: High Risk (11/24/2024)   Patient History    Smoking Tobacco Use: Every Day    Smokeless Tobacco Use: Never    Passive Exposure: Not on file  Financial Resource Strain: High Risk (08/21/2024)   Overall Financial Resource Strain (CARDIA)    Difficulty of Paying Living Expenses: Very hard  Food Insecurity: Food Insecurity Present (08/21/2024)   Epic    Worried About Programme Researcher, Broadcasting/film/video in the Last Year: Often true    Ran Out of Food in the Last Year: Often true  Transportation Needs: No Transportation Needs (08/21/2024)   Epic    Lack of Transportation (Medical): No    Lack of Transportation (Non-Medical): No  Physical Activity: Inactive (08/21/2024)   Exercise Vital Sign    Days of Exercise per Week: 0 days    Minutes of Exercise per Session: Not on file  Stress: Stress Concern Present (08/21/2024)   [employer] of Occupational Health - Occupational Stress Questionnaire    Feeling of Stress: Very much  Social Connections: Socially Isolated (08/21/2024)   Social Connection and Isolation Panel    Frequency of Communication with Friends and Family: More than three times a week    Frequency of Social Gatherings with Friends and Family: Three times a week    Attends Religious Services: Never    Active Member of Clubs or Organizations: No    Attends Banker Meetings: Not on file    Marital Status: Never married  Depression (PHQ2-9): High Risk (08/21/2024)   Depression (PHQ2-9)    PHQ-2 Score: 25  Alcohol Screen: Not on file  Housing: High Risk (08/21/2024)   Epic    Unable to Pay for Housing in the Last Year: Yes    Number of Times Moved  in the Last Year: 0    Homeless in the Last Year: No  Utilities: Not on file  Health Literacy: Not on file    Additional Social History: ***  Allergies:  Allergies[1]  Metabolic Disorder Labs: No results found for: HGBA1C, MPG No results found for: PROLACTIN No results found for: CHOL, TRIG, HDL, CHOLHDL, VLDL, LDLCALC No results found for: TSH  Therapeutic Level Labs: No results found for: LITHIUM No results found for: CBMZ No results found for: VALPROATE  Current Medications: Current Outpatient Medications  Medication Sig Dispense Refill   acetaminophen  (TYLENOL ) 500 MG tablet Take 1 tablet (500 mg total) by mouth every 6 (six) hours as needed. 30 tablet 0   ARIPiprazole  (ABILIFY ) 15 MG tablet TAKE 1 TABLET BY MOUTH DAILY 30 tablet 0   FLUoxetine  (PROZAC ) 20 MG capsule TAKE 3 CAPSULES BY MOUTH DAILY 90 capsule 0   meloxicam  (MOBIC ) 15 MG tablet Take 1 tablet (15 mg total) by mouth daily. 30 tablet 0   methocarbamol  (ROBAXIN ) 500 MG tablet Take 1 tablet (500  mg total) by mouth every 6 (six) hours as needed for muscle spasms. 30 tablet 0   methylPREDNISolone  (MEDROL  DOSEPAK) 4 MG TBPK tablet Take as directed with food do not take with other anti-inflammatories 21 tablet 0   naproxen  (NAPROSYN ) 500 MG tablet Take 1 tablet (500 mg total) by mouth 2 (two) times daily with a meal. 60 tablet 0   No current facility-administered medications for this visit.    Musculoskeletal: Strength & Muscle Tone: within normal limits Gait & Station: normal Patient leans: N/A  Psychiatric Specialty Exam:  Psychiatric Specialty Exam: There were no vitals taken for this visit.There is no height or weight on file to calculate BMI. Review of Systems  General Appearance: Casual and Fairly Groomed  Eye Contact:  Good  Speech:  Clear and Coherent and Normal Rate  Volume:  Normal  Mood:  Euthymic  Affect:  Appropriate  Thought Content: Logical   Suicidal  Thoughts:  No  Homicidal Thoughts:  No  Thought Process:  Linear  Orientation:  Full (Time, Place, and Person)    Memory: Immediate;   Good Recent;   Good  Judgment:  Fair  Insight:  Fair  Concentration:  Concentration: Good and Attention Span: Good  Recall:  not formally assessed   Fund of Knowledge: Good  Language: Fair  Psychomotor Activity:  Normal  Akathisia:  No  AIMS (if indicated): not done  Assets:  Communication Skills Desire for Improvement Financial Resources/Insurance Housing Intimacy Leisure Time Transportation  ADL's:  Intact  Cognition: WNL  Sleep:  Fair    Screenings: GAD-7    Flowsheet Row Office Visit from 08/21/2024 in [facility] at [facility]  Total GAD-7 Score 16   PHQ2-9    Flowsheet Row Office Visit from 08/21/2024 in [facility] at [facility] Total Score 6  PHQ-9 Total Score 25   Flowsheet Row UC from 09/26/2024 in [facility] ED from 11/21/2023 in [facility] Emergency Department at [facility]  C-SSRS RISK CATEGORY No Risk No Risk     Collaboration of Care: Medication Management AEB ***  Patient/Guardian was advised Release of Information must be obtained prior to any record release in order to collaborate their care with an outside provider. Patient/Guardian was advised if they have not already done so to contact the registration department to sign all necessary forms in order for us  to release information regarding their care.   Consent: Patient/Guardian gives verbal consent for treatment and assignment of benefits for services provided during this visit. Patient/Guardian expressed understanding and agreed to proceed.   May Ozment, MD 1/28/202610:58 AM      [1] No Known Allergies "

## 2024-12-14 NOTE — Addendum Note (Signed)
 Addended by: CLEMETINE RONAL DRAGON on: 12/14/2024 10:42 AM   Modules accepted: Orders

## 2024-12-21 ENCOUNTER — Telehealth: Payer: Self-pay

## 2024-12-21 ENCOUNTER — Other Ambulatory Visit: Payer: Self-pay | Admitting: Physical Medicine and Rehabilitation

## 2024-12-21 DIAGNOSIS — F411 Generalized anxiety disorder: Secondary | ICD-10-CM

## 2024-12-21 MED ORDER — DIAZEPAM 5 MG PO TABS: ORAL_TABLET | ORAL | 0 refills | Status: AC

## 2024-12-21 NOTE — Telephone Encounter (Signed)
 Pre procedural Valium

## 2024-12-27 ENCOUNTER — Ambulatory Visit (HOSPITAL_COMMUNITY): Payer: Self-pay

## 2025-01-03 ENCOUNTER — Encounter: Admitting: Physical Medicine and Rehabilitation

## 2025-01-04 ENCOUNTER — Encounter: Admitting: Physical Medicine and Rehabilitation
